# Patient Record
Sex: Female | Born: 1965
Health system: Southern US, Community
[De-identification: ages and names within clinical notes are randomized; demographics above are authoritative.]

## PROBLEM LIST (undated history)

## (undated) DIAGNOSIS — D259 Leiomyoma of uterus, unspecified: Secondary | ICD-10-CM

## (undated) DIAGNOSIS — N840 Polyp of corpus uteri: Secondary | ICD-10-CM

## (undated) DIAGNOSIS — N926 Irregular menstruation, unspecified: Secondary | ICD-10-CM

## (undated) DIAGNOSIS — E039 Hypothyroidism, unspecified: Secondary | ICD-10-CM

## (undated) HISTORY — DX: Hypothyroidism, unspecified: E03.9

## (undated) HISTORY — PX: TUBAL LIGATION: SHX77

## (undated) HISTORY — PX: TONSILLECTOMY: SUR1361

---

## 2002-07-31 HISTORY — PX: TUBAL LIGATION: SHX77

## 2015-08-12 MED FILL — TRI-LO-MARZIA TABLET: 0.18/0.215/ | 28 days supply | Qty: 28 | Fill #0

## 2015-09-09 MED FILL — TRI-LO-MARZIA TABLET: 0.18/0.215/ | 84 days supply | Qty: 84 | Fill #1

## 2015-09-15 MED FILL — LEVOTHYROXINE 112 MCG TAB: 112 | 84 days supply | Qty: 72 | Fill #0

## 2015-11-02 DIAGNOSIS — J029 Acute pharyngitis, unspecified: Secondary | ICD-10-CM | POA: Diagnosis not present

## 2015-11-26 DIAGNOSIS — H52223 Regular astigmatism, bilateral: Secondary | ICD-10-CM | POA: Diagnosis not present

## 2015-11-26 DIAGNOSIS — H524 Presbyopia: Secondary | ICD-10-CM | POA: Diagnosis not present

## 2015-12-02 MED FILL — TRI-LO-MARZIA TABLET: 0.18/0.215/ | 84 days supply | Qty: 84 | Fill #2

## 2016-02-29 MED FILL — TRI-LO-MARZIA TABLET: 0.18/0.215/ | 28 days supply | Qty: 28 | Fill #0

## 2016-06-15 ENCOUNTER — Ambulatory Visit: Payer: Self-pay | Admitting: Women's Health

## 2016-06-19 ENCOUNTER — Telehealth: Payer: Self-pay | Admitting: Emergency Medicine

## 2016-06-19 NOTE — Telephone Encounter (Signed)
appt made

## 2016-06-19 NOTE — Telephone Encounter (Signed)
Pt is wanting to know if you would be willing to see her as a new patient? You see her husband already. Please advise thanks.

## 2016-06-19 NOTE — Telephone Encounter (Signed)
yes

## 2016-06-27 ENCOUNTER — Encounter: Payer: Self-pay | Admitting: Women's Health

## 2016-06-27 ENCOUNTER — Ambulatory Visit (INDEPENDENT_AMBULATORY_CARE_PROVIDER_SITE_OTHER): Payer: 59 | Admitting: Women's Health

## 2016-06-27 ENCOUNTER — Other Ambulatory Visit: Payer: Self-pay | Admitting: Women's Health

## 2016-06-27 VITALS — BP 120/82 | Ht 67.0 in | Wt 179.0 lb

## 2016-06-27 DIAGNOSIS — Z01419 Encounter for gynecological examination (general) (routine) without abnormal findings: Secondary | ICD-10-CM

## 2016-06-27 DIAGNOSIS — Z13220 Encounter for screening for lipoid disorders: Secondary | ICD-10-CM

## 2016-06-27 DIAGNOSIS — N920 Excessive and frequent menstruation with regular cycle: Secondary | ICD-10-CM

## 2016-06-27 DIAGNOSIS — E038 Other specified hypothyroidism: Secondary | ICD-10-CM

## 2016-06-27 DIAGNOSIS — Z1231 Encounter for screening mammogram for malignant neoplasm of breast: Secondary | ICD-10-CM

## 2016-06-27 LAB — CBC WITH DIFFERENTIAL/PLATELET
BASOS PCT: 0 %
Basophils Absolute: 0 cells/uL (ref 0–200)
EOS ABS: 162 {cells}/uL (ref 15–500)
Eosinophils Relative: 2 %
HEMATOCRIT: 41.5 % (ref 35.0–45.0)
Hemoglobin: 13.7 g/dL (ref 11.7–15.5)
Lymphocytes Relative: 23 %
Lymphs Abs: 1863 cells/uL (ref 850–3900)
MCH: 30.3 pg (ref 27.0–33.0)
MCHC: 33 g/dL (ref 32.0–36.0)
MCV: 91.8 fL (ref 80.0–100.0)
MONO ABS: 405 {cells}/uL (ref 200–950)
MPV: 9.5 fL (ref 7.5–12.5)
Monocytes Relative: 5 %
NEUTROS ABS: 5670 {cells}/uL (ref 1500–7800)
Neutrophils Relative %: 70 %
PLATELETS: 303 10*3/uL (ref 140–400)
RBC: 4.52 MIL/uL (ref 3.80–5.10)
RDW: 13.8 % (ref 11.0–15.0)
WBC: 8.1 10*3/uL (ref 3.8–10.8)

## 2016-06-27 LAB — HM PAP SMEAR

## 2016-06-27 LAB — T3 UPTAKE: T3 Uptake: 28 % (ref 22–35)

## 2016-06-27 LAB — TSH: TSH: 3.01 m[IU]/L

## 2016-06-27 LAB — T4: T4 TOTAL: 7.6 ug/dL (ref 4.5–12.0)

## 2016-06-27 MED ORDER — LEVOTHYROXINE SODIUM 112 MCG PO TABS: 112.0000 ug | ORAL_TABLET | Freq: Every day | ORAL | 4 refills | Status: DC

## 2016-06-27 MED ORDER — NORETHIN ACE-ETH ESTRAD-FE 1-20 MG-MCG PO TABS: 1.0000 | ORAL_TABLET | Freq: Every day | ORAL | 4 refills | Status: DC

## 2016-06-27 MED FILL — NORETHIN-ESTRAD-FERR 1-0.02: 1-20 | 84 days supply | Qty: 84 | Fill #0

## 2016-06-27 NOTE — Addendum Note (Signed)
Addended by: Burnett Kanaris on: 06/27/2016 12:28 PM   Modules accepted: Orders

## 2016-06-27 NOTE — Patient Instructions (Addendum)
[facility] center  [phone]  [facility] [address] Health Maintenance, Female Introduction Adopting a healthy lifestyle and getting preventive care can go a long way to promote health and wellness. Talk with your health care provider about what schedule of regular examinations is right for you. This is a good chance for you to check in with your provider about disease prevention and staying healthy. In between checkups, there are plenty of things you can do on your own. Experts have done a lot of research about which lifestyle changes and preventive measures are most likely to keep you healthy. Ask your health care provider for more information. Weight and diet Eat a healthy diet  Be sure to include plenty of vegetables, fruits, low-fat dairy products, and lean protein.  Do not eat a lot of foods high in solid fats, added sugars, or salt.  Get regular exercise. This is one of the most important things you can do for your health.  Most adults should exercise for at least 150 minutes each week. The exercise should increase your heart rate and make you sweat (moderate-intensity exercise).  Most adults should also do strengthening exercises at least twice a week. This is in addition to the moderate-intensity exercise. Maintain a healthy weight  Body mass index (BMI) is a measurement that can be used to identify possible weight problems. It estimates body fat based on height and weight. Your health care provider can help determine your BMI and help you achieve or maintain a healthy weight.  For females 49 years of age and older:  A BMI below 18.5 is considered underweight.  A BMI of 18.5 to 24.9 is normal.  A BMI of 25 to 29.9 is considered overweight.  A BMI of 30 and above is considered obese. Watch levels of cholesterol and blood lipids  You should start having your blood tested for lipids and cholesterol at 48 years of age, then have this test every 5 years.  You may need to  have your cholesterol levels checked more often if:  Your lipid or cholesterol levels are high.  You are older than 48 years of age.  You are at high risk for heart disease. Cancer screening Lung Cancer  Lung cancer screening is recommended for adults 38-26 years old who are at high risk for lung cancer because of a history of smoking.  A yearly low-dose CT scan of the lungs is recommended for people who:  Currently smoke.  Have quit within the past 15 years.  Have at least a 30-pack-year history of smoking. A pack year is smoking an average of one pack of cigarettes a day for 1 year.  Yearly screening should continue until it has been 15 years since you quit.  Yearly screening should stop if you develop a health problem that would prevent you from having lung cancer treatment. Breast Cancer  Practice breast self-awareness. This means understanding how your breasts normally appear and feel.  It also means doing regular breast self-exams. Let your health care provider know about any changes, no matter how small.  If you are in your 20s or 30s, you should have a clinical breast exam (CBE) by a health care provider every 1-3 years as part of a regular health exam.  If you are 75 or older, have a CBE every year. Also consider having a breast X-ray (mammogram) every year.  If you have a family history of breast cancer, talk to your health care provider about genetic  screening.  If you are at high risk for breast cancer, talk to your health care provider about having an MRI and a mammogram every year.  Breast cancer gene (BRCA) assessment is recommended for women who have family members with BRCA-related cancers. BRCA-related cancers include:  Breast.  Ovarian.  Tubal.  Peritoneal cancers.  Results of the assessment will determine the need for genetic counseling and BRCA1 and BRCA2 testing. Cervical Cancer  Your health care provider may recommend that you be screened  regularly for cancer of the pelvic organs (ovaries, uterus, and vagina). This screening involves a pelvic examination, including checking for microscopic changes to the surface of your cervix (Pap test). You may be encouraged to have this screening done every 3 years, beginning at age 69.  For women ages 20-65, health care providers may recommend pelvic exams and Pap testing every 3 years, or they may recommend the Pap and pelvic exam, combined with testing for human papilloma virus (HPV), every 5 years. Some types of HPV increase your risk of cervical cancer. Testing for HPV may also be done on women of any age with unclear Pap test results.  Other health care providers may not recommend any screening for nonpregnant women who are considered low risk for pelvic cancer and who do not have symptoms. Ask your health care provider if a screening pelvic exam is right for you.  If you have had past treatment for cervical cancer or a condition that could lead to cancer, you need Pap tests and screening for cancer for at least 20 years after your treatment. If Pap tests have been discontinued, your risk factors (such as having a new sexual partner) need to be reassessed to determine if screening should resume. Some women have medical problems that increase the chance of getting cervical cancer. In these cases, your health care provider may recommend more frequent screening and Pap tests. Colorectal Cancer  This type of cancer can be detected and often prevented.  Routine colorectal cancer screening usually begins at 48 years of age and continues through 48 years of age.  Your health care provider may recommend screening at an earlier age if you have risk factors for colon cancer.  Your health care provider may also recommend using home test kits to check for hidden blood in the stool.  A small camera at the end of a tube can be used to examine your colon directly (sigmoidoscopy or colonoscopy). This is  done to check for the earliest forms of colorectal cancer.  Routine screening usually begins at age 40.  Direct examination of the colon should be repeated every 5-10 years through 48 years of age. However, you may need to be screened more often if early forms of precancerous polyps or small growths are found. Skin Cancer  Check your skin from head to toe regularly.  Tell your health care provider about any new moles or changes in moles, especially if there is a change in a mole's shape or color.  Also tell your health care provider if you have a mole that is larger than the size of a pencil eraser.  Always use sunscreen. Apply sunscreen liberally and repeatedly throughout the day.  Protect yourself by wearing long sleeves, pants, a wide-brimmed hat, and sunglasses whenever you are outside. Heart disease, diabetes, and high blood pressure  High blood pressure causes heart disease and increases the risk of stroke. High blood pressure is more likely to develop in:  People who have blood pressure  in the high end of the normal range (130-139/85-89 mm Hg).  People who are overweight or obese.  People who are African American.  If you are 45-64 years of age, have your blood pressure checked every 3-5 years. If you are 63 years of age or older, have your blood pressure checked every year. You should have your blood pressure measured twice-once when you are at a hospital or clinic, and once when you are not at a hospital or clinic. Record the average of the two measurements. To check your blood pressure when you are not at a hospital or clinic, you can use:  An automated blood pressure machine at a pharmacy.  A home blood pressure monitor.  If you are between 44 years and 36 years old, ask your health care provider if you should take aspirin to prevent strokes.  Have regular diabetes screenings. This involves taking a blood sample to check your fasting blood sugar level.  If you are at a  normal weight and have a low risk for diabetes, have this test once every three years after 48 years of age.  If you are overweight and have a high risk for diabetes, consider being tested at a younger age or more often. Preventing infection Hepatitis B  If you have a higher risk for hepatitis B, you should be screened for this virus. You are considered at high risk for hepatitis B if:  You were born in a country where hepatitis B is common. Ask your health care provider which countries are considered high risk.  Your parents were born in a high-risk country, and you have not been immunized against hepatitis B (hepatitis B vaccine).  You have HIV or AIDS.  You use needles to inject street drugs.  You live with someone who has hepatitis B.  You have had sex with someone who has hepatitis B.  You get hemodialysis treatment.  You take certain medicines for conditions, including cancer, organ transplantation, and autoimmune conditions. Hepatitis C  Blood testing is recommended for:  Everyone born from [birth year] through 1963.  Anyone with known risk factors for hepatitis C. Sexually transmitted infections (STIs)  You should be screened for sexually transmitted infections (STIs) including gonorrhea and chlamydia if:  You are sexually active and are younger than 48 years of age.  You are older than 48 years of age and your health care provider tells you that you are at risk for this type of infection.  Your sexual activity has changed since you were last screened and you are at an increased risk for chlamydia or gonorrhea. Ask your health care provider if you are at risk.  If you do not have HIV, but are at risk, it may be recommended that you take a prescription medicine daily to prevent HIV infection. This is called pre-exposure prophylaxis (PrEP). You are considered at risk if:  You are sexually active and do not regularly use condoms or know the HIV status of your partner(s).  You  take drugs by injection.  You are sexually active with a partner who has HIV. Talk with your health care provider about whether you are at high risk of being infected with HIV. If you choose to begin PrEP, you should first be tested for HIV. You should then be tested every 3 months for as long as you are taking PrEP. Pregnancy  If you are premenopausal and you may become pregnant, ask your health care provider about preconception counseling.  If you  may become pregnant, take 400 to 800 micrograms (mcg) of folic acid every day.  If you want to prevent pregnancy, talk to your health care provider about birth control (contraception). Osteoporosis and menopause  Osteoporosis is a disease in which the bones lose minerals and strength with aging. This can result in serious bone fractures. Your risk for osteoporosis can be identified using a bone density scan.  If you are 74 years of age or older, or if you are at risk for osteoporosis and fractures, ask your health care provider if you should be screened.  Ask your health care provider whether you should take a calcium or vitamin D supplement to lower your risk for osteoporosis.  Menopause may have certain physical symptoms and risks.  Hormone replacement therapy may reduce some of these symptoms and risks. Talk to your health care provider about whether hormone replacement therapy is right for you. Follow these instructions at home:  Schedule regular health, dental, and eye exams.  Stay current with your immunizations.  Do not use any tobacco products including cigarettes, chewing tobacco, or electronic cigarettes.  If you are pregnant, do not drink alcohol.  If you are breastfeeding, limit how much and how often you drink alcohol.  Limit alcohol intake to no more than 1 drink per day for nonpregnant women. One drink equals 12 ounces of beer, 5 ounces of wine, or 1 ounces of hard liquor.  Do not use street drugs.  Do not share  needles.  Ask your health care provider for help if you need support or information about quitting drugs.  Tell your health care provider if you often feel depressed.  Tell your health care provider if you have ever been abused or do not feel safe at home. This information is not intended to replace advice given to you by your health care provider. Make sure you discuss any questions you have with your health care provider. Document Released: 01/30/2011 Document Revised: 12/23/2015 Document Reviewed: 04/20/2015  2017 Elsevier  Endometrial Ablation Endometrial ablation removes the lining of the uterus (endometrium). It is usually a same-day, outpatient treatment. Ablation helps avoid major surgery, such as surgery to remove the cervix and uterus (hysterectomy). After endometrial ablation, you will have little or no menstrual bleeding and may not be able to have children. However, if you are premenopausal, you will need to use a reliable method of birth control following the procedure because of the small chance that pregnancy can occur. There are different reasons to have this procedure. These reasons include:  Heavy periods.  Bleeding that is causing anemia.  Irregular bleeding.  Bleeding fibroids on the lining inside the uterus if they are smaller than 3 centimeters. This procedure may not be possible for you if:   You want to have children in the future.   You have severe cramps with your menstrual period.   You have precancerous or cancerous cells in your uterus.   You were recently pregnant.   You have gone through menopause.   You have had major surgery on your uterus, resulting in thinning of the uterine wall. Surgeries may include:  The removal of one or more uterine fibroids (myomectomy).  A cesarean section with a classic (vertical) incision on your uterus. Ask your health care provider what type of cesarean you had. Sometimes the scar on your skin is different  than the scar on your uterus. Even if you have had surgery on your uterus, certain types of ablation may still  be safe for you. Talk with your health care provider. LET [facility] CARE PROVIDER KNOW ABOUT:  Any allergies you have.  All medicines you are taking, including vitamins, herbs, eye drops, creams, and over-the-counter medicines.  Previous problems you or members of your family have had with the use of anesthetics.  Any blood disorders you have.  Previous surgeries you have had.  Medical conditions you have. RISKS AND COMPLICATIONS  Generally, this is a safe procedure. However, as with any procedure, complications can occur. Possible complications include:  Perforation of the uterus.  Bleeding.  Infection of the uterus, bladder, or vagina.  Injury to surrounding organs.  An air bubble to the lung (air embolus).  Pregnancy following the procedure.  Failure of the procedure to help the problem, requiring hysterectomy.  Decreased ability to diagnose cancer in the lining of the uterus. BEFORE THE PROCEDURE  The lining of the uterus must be tested to make sure there is no pre-cancerous or cancer cells present.  An ultrasound may be performed to look at the size of the uterus and to check for abnormalities.  Medicines may be given to thin the lining of the uterus. PROCEDURE  During the procedure, your health care provider will use a tool called a resectoscope to help see inside your uterus. There are different ways to remove the lining of your uterus.   Radiofrequency - This method uses a radiofrequency-alternating electric current to remove the lining of the uterus.  Cryotherapy - This method uses extreme cold to freeze the lining of the uterus.  Heated-Free Liquid - This method uses heated salt (saline) solution to remove the lining of the uterus.  Microwave - This method uses high-energy microwaves to heat up the lining of the uterus to remove it.  Thermal  balloon - This method involves inserting a catheter with a balloon tip into the uterus. The balloon tip is filled with heated fluid to remove the lining of the uterus. AFTER THE PROCEDURE  After your procedure, do not have sexual intercourse or insert anything into your vagina until permitted by your health care provider. After the procedure, you may experience:  Cramps.  Vaginal discharge.  Frequent urination. This information is not intended to replace advice given to you by your health care provider. Make sure you discuss any questions you have with your health care provider. Document Released: 05/26/2004 Document Revised: 04/07/2015 Document Reviewed: 12/18/2012 Elsevier Interactive Patient Education  2017 [employer].  Hypothyroidism Hypothyroidism is a disorder of the thyroid. The thyroid is a large gland that is located in the lower front of the neck. The thyroid releases hormones that control how the body works. With hypothyroidism, the thyroid does not make enough of these hormones. What are the causes? Causes of hypothyroidism may include:  Viral infections.  Pregnancy.  Your own defense system (immune system) attacking your thyroid.  Certain medicines.  Birth defects.  Past radiation treatments to your head or neck.  Past treatment with radioactive iodine.  Past surgical removal of part or all of your thyroid.  Problems with the gland that is located in the center of your brain (pituitary). What are the signs or symptoms? Signs and symptoms of hypothyroidism may include:  Feeling as though you have no energy (lethargy).  Inability to tolerate cold.  Weight gain that is not explained by a change in diet or exercise habits.  Dry skin.  Coarse hair.  Menstrual irregularity.  Slowing of thought processes.  Constipation.  Sadness  or depression. How is this diagnosed? Your health care provider may diagnose hypothyroidism with blood tests and ultrasound  tests. How is this treated? Hypothyroidism is treated with medicine that replaces the hormones that your body does not make. After you begin treatment, it may take several weeks for symptoms to go away. Follow these instructions at home:  Take medicines only as directed by your health care provider.  If you start taking any new medicines, tell your health care provider.  Keep all follow-up visits as directed by your health care provider. This is important. As your condition improves, your dosage needs may change. You will need to have blood tests regularly so that your health care provider can watch your condition. Contact a health care provider if:  Your symptoms do not get better with treatment.  You are taking thyroid replacement medicine and:  You sweat excessively.  You have tremors.  You feel anxious.  You lose weight rapidly.  You cannot tolerate heat.  You have emotional swings.  You have diarrhea.  You feel weak. Get help right away if:  You develop chest pain.  You develop an irregular heartbeat.  You develop a rapid heartbeat. This information is not intended to replace advice given to you by your health care provider. Make sure you discuss any questions you have with your health care provider. Document Released: 07/17/2005 Document Revised: 12/23/2015 Document Reviewed: 12/02/2013 Elsevier Interactive Patient Education  2017 [employer].

## 2016-06-27 NOTE — Progress Notes (Signed)
Kristen Mccarty [birth year] 50 [id number]    History:    Presents for new patient annual exam.  Monthly cycle/BTL. Recently moved here from [place], had been on OCs  with good relief of menorrhagia.  Reports normal Pap and mammogram history.  Past medical history, past surgical history, family history and social history were all reviewed and documented in the EPIC chart. 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 Oldest son at [facility], 48 year old daughter at [facility], 24 year old son at home all have had gardasil. Originally from [place].  ROS:  A ROS was performed and pertinent positives and negatives are included.  Exam:  Vitals:   06/27/16 1106  BP: 120/82  Weight: 179 lb (81.2 kg)  Height: 5\' 7"  (1.702 m)   Body mass index is 28.04 kg/m.   General appearance:  Normal Thyroid:  Symmetrical, normal in size, without palpable masses or nodularity. Respiratory  Auscultation:  Clear without wheezing or rhonchi Cardiovascular  Auscultation:  Regular rate, without rubs, murmurs or gallops  Edema/varicosities:  Not grossly evident Abdominal  Soft,nontender, without masses, guarding or rebound.  Liver/spleen:  No organomegaly noted  Hernia:  None appreciated  Skin  Inspection:  Grossly normal   Breasts: Examined lying and sitting.     Right: Without masses, retractions, discharge or axillary adenopathy.     Left: Without masses, retractions, discharge or axillary adenopathy. Gentitourinary   Inguinal/mons:  Normal without inguinal adenopathy  External genitalia:  Normal  BUS/Urethra/Skene's glands:  Normal  Vagina:  Normal  Cervix:  Normal  Uterus:   normal in size, shape and contour.  Midline and mobile  Adnexa/parametria:     Rt: Without masses or tenderness.   Lt: Without masses or tenderness.  Anus and perineum: Normal  Digital rectal exam: Normal sphincter tone without palpated masses or tenderness  Assessment/Plan:  50 y.o. MWF G3 P3  for annual exam  requesting to get back on OCs.  Monthly cycle/BTL with menorrhagia Hypothyroid-SYNTHROID 112 since August or September/ ran out    of Rx.  Plan: Options reviewed, Loestrin 1/20 prescription, proper use, slight risk for blood clots and strokes reviewed. Start first day of next cycle. Take daily. SBE's, continue annual screening mammogram, breast center information given instructed to call and schedule. Reviewed importance of regular exercise, calcium rich diet, vitamin D 1000 daily encouraged. Synthroid 112 prescription, proper use given and reviewed, recheck TSH in 3 months, CBC, CMP, T3, T4, TSH all of vitamin D, lipid panel, UA, Pap with HR HPV typing, new screening guidelines reviewed. Endometrial ablation reviewed. Instructed to recheck blood pressure in 3 months to make sure no elevations.    Huel Cote [facility], 12:04 PM 06/27/2016

## 2016-06-28 LAB — LIPID PANEL
CHOL/HDL RATIO: 2.8 ratio (ref <5.0)
CHOLESTEROL: 173 mg/dL (ref <200)
HDL: 62 mg/dL (ref >50)
LDL Cholesterol: 89 mg/dL (ref <100)
Triglycerides: 108 mg/dL (ref <150)
VLDL: 22 mg/dL (ref <30)

## 2016-06-28 LAB — URINALYSIS W MICROSCOPIC + REFLEX CULTURE
Bacteria, UA: NONE SEEN [HPF]
Bilirubin Urine: NEGATIVE
CASTS: NONE SEEN [LPF]
Crystals: NONE SEEN [HPF]
Glucose, UA: NEGATIVE
Hgb urine dipstick: NEGATIVE
Ketones, ur: NEGATIVE
Leukocytes, UA: NEGATIVE
NITRITE: NEGATIVE
PH: 5.5 (ref 5.0–8.0)
Protein, ur: NEGATIVE
RBC / HPF: NONE SEEN RBC/HPF (ref <=2)
SPECIFIC GRAVITY, URINE: 1.023 (ref 1.001–1.035)
Squamous Epithelial / LPF: NONE SEEN [HPF] (ref <=5)
WBC, UA: NONE SEEN WBC/HPF (ref <=5)
YEAST: NONE SEEN [HPF]

## 2016-06-28 LAB — COMPREHENSIVE METABOLIC PANEL
ALK PHOS: 76 U/L (ref 33–115)
ALT: 23 U/L (ref 6–29)
AST: 21 U/L (ref 10–35)
Albumin: 4.5 g/dL (ref 3.6–5.1)
BILIRUBIN TOTAL: 1 mg/dL (ref 0.2–1.2)
BUN: 19 mg/dL (ref 7–25)
CALCIUM: 9.1 mg/dL (ref 8.6–10.2)
CO2: 24 mmol/L (ref 20–31)
Chloride: 105 mmol/L (ref 98–110)
Creat: 0.76 mg/dL (ref 0.50–1.10)
Glucose, Bld: 78 mg/dL (ref 65–99)
POTASSIUM: 4 mmol/L (ref 3.5–5.3)
Sodium: 140 mmol/L (ref 135–146)
TOTAL PROTEIN: 6.6 g/dL (ref 6.1–8.1)

## 2016-06-28 LAB — VITAMIN D 25 HYDROXY (VIT D DEFICIENCY, FRACTURES): Vit D, 25-Hydroxy: 31 ng/mL (ref 30–100)

## 2016-06-29 LAB — PAP, TP IMAGING W/ HPV RNA, RFLX HPV TYPE 16,18/45: HPV MRNA, HIGH RISK: NOT DETECTED

## 2016-06-30 ENCOUNTER — Other Ambulatory Visit: Payer: Self-pay | Admitting: Women's Health

## 2016-06-30 DIAGNOSIS — R7989 Other specified abnormal findings of blood chemistry: Secondary | ICD-10-CM

## 2016-06-30 MED ORDER — LEVOTHYROXINE SODIUM 75 MCG PO TABS: 75.0000 ug | ORAL_TABLET | Freq: Every day | ORAL | 1 refills | Status: DC

## 2016-06-30 MED FILL — LEVOTHYROXINE 75 MCG TABLET: 75 | 30 days supply | Qty: 30 | Fill #0

## 2016-07-11 ENCOUNTER — Ambulatory Visit (INDEPENDENT_AMBULATORY_CARE_PROVIDER_SITE_OTHER): Payer: 59 | Admitting: Internal Medicine

## 2016-07-11 ENCOUNTER — Encounter: Payer: Self-pay | Admitting: Internal Medicine

## 2016-07-11 VITALS — BP 120/78 | HR 79 | Temp 98.2°F | Ht 67.0 in | Wt 179.4 lb

## 2016-07-11 DIAGNOSIS — Z0000 Encounter for general adult medical examination without abnormal findings: Secondary | ICD-10-CM | POA: Diagnosis not present

## 2016-07-11 DIAGNOSIS — Z23 Encounter for immunization: Secondary | ICD-10-CM | POA: Diagnosis not present

## 2016-07-11 DIAGNOSIS — G4719 Other hypersomnia: Secondary | ICD-10-CM | POA: Diagnosis not present

## 2016-07-11 NOTE — Patient Instructions (Signed)
Hypothyroidism Hypothyroidism is a disorder of the thyroid. The thyroid is a large gland that is located in the lower front of the neck. The thyroid releases hormones that control how the body works. With hypothyroidism, the thyroid does not make enough of these hormones. What are the causes? Causes of hypothyroidism may include:  Viral infections.  Pregnancy.  Your own defense system (immune system) attacking your thyroid.  Certain medicines.  Birth defects.  Past radiation treatments to your head or neck.  Past treatment with radioactive iodine.  Past surgical removal of part or all of your thyroid.  Problems with the gland that is located in the center of your brain (pituitary).  What are the signs or symptoms? Signs and symptoms of hypothyroidism may include:  Feeling as though you have no energy (lethargy).  Inability to tolerate cold.  Weight gain that is not explained by a change in diet or exercise habits.  Dry skin.  Coarse hair.  Menstrual irregularity.  Slowing of thought processes.  Constipation.  Sadness or depression.  How is this diagnosed? Your health care provider may diagnose hypothyroidism with blood tests and ultrasound tests. How is this treated? Hypothyroidism is treated with medicine that replaces the hormones that your body does not make. After you begin treatment, it may take several weeks for symptoms to go away. Follow these instructions at home:  Take medicines only as directed by your health care provider.  If you start taking any new medicines, tell your health care provider.  Keep all follow-up visits as directed by your health care provider. This is important. As your condition improves, your dosage needs may change. You will need to have blood tests regularly so that your health care provider can watch your condition. Contact a health care provider if:  Your symptoms do not get better with treatment.  You are taking thyroid  replacement medicine and: ? You sweat excessively. ? You have tremors. ? You feel anxious. ? You lose weight rapidly. ? You cannot tolerate heat. ? You have emotional swings. ? You have diarrhea. ? You feel weak. Get help right away if:  You develop chest pain.  You develop an irregular heartbeat.  You develop a rapid heartbeat. This information is not intended to replace advice given to you by your health care provider. Make sure you discuss any questions you have with your health care provider. Document Released: 07/17/2005 Document Revised: 12/23/2015 Document Reviewed: 12/02/2013 Elsevier Interactive Patient Education  2017 [employer]  

## 2016-07-11 NOTE — Progress Notes (Signed)
Pre visit review using our clinic review tool, if applicable. No additional management support is needed unless otherwise documented below in the visit note. 

## 2016-07-11 NOTE — Progress Notes (Signed)
Subjective:  Patient ID: Kristen Mccarty, female    DOB: 29-Mar-1966  Age: 50 y.o. MRN: [id number]  CC: Annual Exam   HPI Kristen Mccarty presents for a CPX.  She has a history of hypothyroidism and a recent TSH was 3.01. She complains for several years that she has had daytime sleepiness and fatigue. Her husband has a severe snoring problem. She doesn't admit to having a problem with snoring or sleep apnea. She states that the recent reinstitution of her thyroid supplement has not made her feel better. She takes birth control pills to regulate her menstrual cycles.  History Kristen Mccarty has a past medical history of Hypothyroidism.   She has a past surgical history that includes Tubal ligation and Tonsillectomy.   Her family history includes Cancer (age of onset: 31) in her father; Diabetes in her father; Heart attack in her maternal grandmother; Heart disease in her father; Leukemia in her paternal grandmother; Osteoporosis in her mother.She reports that she has never smoked. She has never used smokeless tobacco. She reports that she does not drink alcohol or use drugs.  Outpatient Medications Prior to Visit  Medication Sig Dispense Refill  . levothyroxine (SYNTHROID) 75 MCG tablet Take 1 tablet (75 mcg total) by mouth daily before breakfast. 30 tablet 1  . norethindrone-ethinyl estradiol (JUNEL FE,GILDESS FE,LOESTRIN FE) 1-20 MG-MCG tablet Take 1 tablet by mouth daily. 3 Package 4   No facility-administered medications prior to visit.     ROS Review of Systems  Constitutional: Positive for fatigue. Negative for activity change, appetite change, diaphoresis and unexpected weight change.  HENT: Negative.   Eyes: Negative.   Respiratory: Negative for apnea, cough, chest tightness, shortness of breath, wheezing and stridor.   Cardiovascular: Negative.  Negative for chest pain, palpitations and leg swelling.  Gastrointestinal: Negative for abdominal pain, blood in stool, constipation, diarrhea,  nausea and vomiting.  Endocrine: Negative.   Genitourinary: Negative.   Musculoskeletal: Negative.  Negative for arthralgias, back pain, myalgias and neck pain.  Skin: Negative.  Negative for color change and rash.  Allergic/Immunologic: Negative.   Neurological: Negative.  Negative for dizziness, weakness, light-headedness and headaches.  Hematological: Negative.  Negative for adenopathy. Does not bruise/bleed easily.  Psychiatric/Behavioral: Negative.  Negative for behavioral problems, decreased concentration, dysphoric mood, self-injury and suicidal ideas. The patient is not nervous/anxious.     Objective:  BP 120/78 (BP Location: Left Arm, Patient Position: Sitting, Cuff Size: Large)   Pulse 79   Temp 98.2 F (36.8 C) (Oral)   Ht 5\' 7"  (1.702 m)   Wt 179 lb 6 oz (81.4 kg)   LMP 07/10/2016 (Within Days)   SpO2 98%   BMI 28.09 kg/m   Physical Exam  Constitutional: She is oriented to person, place, and time. She appears well-developed and well-nourished. No distress.  HENT:  Head: Normocephalic and atraumatic.  Mouth/Throat: Oropharynx is clear and moist. No oropharyngeal exudate.  Eyes: Conjunctivae are normal. Right eye exhibits no discharge. Left eye exhibits no discharge. No scleral icterus.  Neck: Normal range of motion. Neck supple. No JVD present. No tracheal deviation present. No thyromegaly present.  Cardiovascular: Normal rate, regular rhythm, normal heart sounds and intact distal pulses.  Exam reveals no gallop and no friction rub.   No murmur heard. Pulmonary/Chest: Effort normal and breath sounds normal. No stridor. No respiratory distress. She has no wheezes. She has no rales. She exhibits no tenderness.  Abdominal: Soft. Bowel sounds are normal. She exhibits no distension and no mass.  There is no tenderness. There is no rebound and no guarding.  Musculoskeletal: Normal range of motion. She exhibits no edema or deformity.  Lymphadenopathy:    She has no cervical  adenopathy.  Neurological: She is oriented to person, place, and time.  Skin: Skin is warm and dry. No rash noted. She is not diaphoretic. No erythema. No pallor.  Psychiatric: She has a normal mood and affect. Her behavior is normal. Judgment and thought content normal.  Vitals reviewed.   Lab Results  Component Value Date   WBC 8.1 06/27/2016   HGB 13.7 06/27/2016   HCT 41.5 06/27/2016   PLT 303 06/27/2016   GLUCOSE 78 06/27/2016   CHOL 173 06/27/2016   TRIG 108 06/27/2016   HDL 62 06/27/2016   LDLCALC 89 06/27/2016   ALT 23 06/27/2016   AST 21 06/27/2016   NA 140 06/27/2016   K 4.0 06/27/2016   CL 105 06/27/2016   CREATININE 0.76 06/27/2016   BUN 19 06/27/2016   CO2 24 06/27/2016   TSH 3.01 06/27/2016    Assessment & Plan:   Kristen Mccarty was seen today for annual exam.  Diagnoses and all orders for this visit:  Excessive daytime sleepiness- her review of systems is otherwise negative, recent labs were normal, I don't think this is related to hypothyroidism. I am concerned about a sleep disorder and have asked her to be evaluated in the sleep clinic. -     Ambulatory referral to Pulmonology  Need for prophylactic vaccination with combined diphtheria-tetanus-pertussis (DTP) vaccine -     Tdap vaccine greater than or equal to 7yo IM  Routine general medical examination at a health care facility- she saw her gynecologist about a month ago so labs, Pap smear, and mammogram of all been addressed. Today her vaccines were reviewed and updated. Patient education material was given.   I am having Kristen Mccarty maintain her norethindrone-ethinyl estradiol and levothyroxine.  No orders of the defined types were placed in this encounter.  Lab Results  Component Value Date   WBC 8.1 06/27/2016   HGB 13.7 06/27/2016   HCT 41.5 06/27/2016   PLT 303 06/27/2016   GLUCOSE 78 06/27/2016   CHOL 173 06/27/2016   TRIG 108 06/27/2016   HDL 62 06/27/2016   LDLCALC 89 06/27/2016   ALT 23  06/27/2016   AST 21 06/27/2016   NA 140 06/27/2016   K 4.0 06/27/2016   CL 105 06/27/2016   CREATININE 0.76 06/27/2016   BUN 19 06/27/2016   CO2 24 06/27/2016   TSH 3.01 06/27/2016    Follow-up: Return in about 3 months (around 10/09/2016).  Scarlette Calico, MD

## 2016-07-12 DIAGNOSIS — Z0000 Encounter for general adult medical examination without abnormal findings: Secondary | ICD-10-CM | POA: Insufficient documentation

## 2016-07-18 ENCOUNTER — Ambulatory Visit
Admission: RE | Admit: 2016-07-18 | Discharge: 2016-07-18 | Disposition: A | Discharge status: Home / Self Care | Payer: 59 | Source: Ambulatory Visit | Attending: Women's Health | Admitting: Women's Health

## 2016-07-18 DIAGNOSIS — Z1231 Encounter for screening mammogram for malignant neoplasm of breast: Secondary | ICD-10-CM | POA: Diagnosis not present

## 2016-07-27 ENCOUNTER — Encounter: Payer: Self-pay | Admitting: Women's Health

## 2016-08-11 MED FILL — LEVOTHYROXINE 75 MCG TABLET: 75 | 30 days supply | Qty: 30 | Fill #1

## 2016-08-28 ENCOUNTER — Other Ambulatory Visit: Payer: Self-pay

## 2016-08-31 ENCOUNTER — Other Ambulatory Visit: Payer: 59

## 2016-08-31 DIAGNOSIS — R946 Abnormal results of thyroid function studies: Secondary | ICD-10-CM | POA: Diagnosis not present

## 2016-08-31 DIAGNOSIS — R7989 Other specified abnormal findings of blood chemistry: Secondary | ICD-10-CM

## 2016-08-31 LAB — TSH: TSH: 1.36 mIU/L

## 2016-09-01 ENCOUNTER — Encounter: Payer: Self-pay | Admitting: Women's Health

## 2016-09-04 ENCOUNTER — Other Ambulatory Visit: Payer: Self-pay | Admitting: Women's Health

## 2016-09-04 DIAGNOSIS — E038 Other specified hypothyroidism: Secondary | ICD-10-CM

## 2016-09-04 MED ORDER — LEVOTHYROXINE SODIUM 75 MCG PO TABS: 75.0000 ug | ORAL_TABLET | Freq: Every day | ORAL | 4 refills | Status: DC

## 2016-09-05 MED FILL — LEVOTHYROXINE 75 MCG TABLET: 75 | 90 days supply | Qty: 90 | Fill #0

## 2016-09-27 ENCOUNTER — Institutional Professional Consult (permissible substitution): Payer: 59 | Admitting: Pulmonary Disease

## 2016-10-11 ENCOUNTER — Ambulatory Visit: Payer: 59 | Admitting: Internal Medicine

## 2016-12-11 MED FILL — LEVOTHYROXINE 75 MCG TABLET: 75 | 90 days supply | Qty: 90 | Fill #1

## 2016-12-19 ENCOUNTER — Encounter: Payer: Self-pay | Admitting: Women's Health

## 2016-12-19 ENCOUNTER — Other Ambulatory Visit: Payer: Self-pay | Admitting: Women's Health

## 2016-12-19 MED ORDER — LEVONORGESTREL-ETHINYL ESTRAD 0.1-20 MG-MCG PO TABS: 1.0000 | ORAL_TABLET | Freq: Every day | ORAL | 2 refills | Status: DC

## 2016-12-19 MED FILL — LEVONOR-ETH ESTRAD 0.1-0.02: 0.1-20 | 84 days supply | Qty: 84 | Fill #0

## 2017-01-14 DIAGNOSIS — N390 Urinary tract infection, site not specified: Secondary | ICD-10-CM | POA: Diagnosis not present

## 2017-01-15 DIAGNOSIS — N390 Urinary tract infection, site not specified: Secondary | ICD-10-CM | POA: Diagnosis not present

## 2017-02-22 ENCOUNTER — Encounter: Payer: Self-pay | Admitting: Women's Health

## 2017-03-09 MED FILL — LEVOTHYROXINE 75 MCG TABLET: 75 | 90 days supply | Qty: 90 | Fill #2

## 2017-04-04 DIAGNOSIS — D225 Melanocytic nevi of trunk: Secondary | ICD-10-CM | POA: Diagnosis not present

## 2017-04-04 DIAGNOSIS — D2262 Melanocytic nevi of left upper limb, including shoulder: Secondary | ICD-10-CM | POA: Diagnosis not present

## 2017-04-04 DIAGNOSIS — L821 Other seborrheic keratosis: Secondary | ICD-10-CM | POA: Diagnosis not present

## 2017-04-04 DIAGNOSIS — D1801 Hemangioma of skin and subcutaneous tissue: Secondary | ICD-10-CM | POA: Diagnosis not present

## 2017-04-04 DIAGNOSIS — D485 Neoplasm of uncertain behavior of skin: Secondary | ICD-10-CM | POA: Diagnosis not present

## 2017-04-04 DIAGNOSIS — D2261 Melanocytic nevi of right upper limb, including shoulder: Secondary | ICD-10-CM | POA: Diagnosis not present

## 2017-04-04 DIAGNOSIS — L811 Chloasma: Secondary | ICD-10-CM | POA: Diagnosis not present

## 2017-04-04 DIAGNOSIS — D2272 Melanocytic nevi of left lower limb, including hip: Secondary | ICD-10-CM | POA: Diagnosis not present

## 2017-04-04 DIAGNOSIS — L814 Other melanin hyperpigmentation: Secondary | ICD-10-CM | POA: Diagnosis not present

## 2017-04-04 DIAGNOSIS — D2271 Melanocytic nevi of right lower limb, including hip: Secondary | ICD-10-CM | POA: Diagnosis not present

## 2017-04-12 DIAGNOSIS — H1131 Conjunctival hemorrhage, right eye: Secondary | ICD-10-CM | POA: Diagnosis not present

## 2017-06-12 MED FILL — LEVOTHYROXINE 75 MCG TABLET: 75 | 90 days supply | Qty: 90 | Fill #3

## 2017-06-27 ENCOUNTER — Encounter: Payer: Self-pay | Admitting: Women's Health

## 2017-06-27 ENCOUNTER — Ambulatory Visit (INDEPENDENT_AMBULATORY_CARE_PROVIDER_SITE_OTHER): Payer: 59 | Admitting: Women's Health

## 2017-06-27 VITALS — BP 122/80 | Ht 67.0 in | Wt 188.0 lb

## 2017-06-27 DIAGNOSIS — E038 Other specified hypothyroidism: Secondary | ICD-10-CM | POA: Diagnosis not present

## 2017-06-27 DIAGNOSIS — Z13220 Encounter for screening for lipoid disorders: Secondary | ICD-10-CM | POA: Diagnosis not present

## 2017-06-27 DIAGNOSIS — Z01419 Encounter for gynecological examination (general) (routine) without abnormal findings: Secondary | ICD-10-CM

## 2017-06-27 LAB — CBC WITH DIFFERENTIAL/PLATELET
BASOS PCT: 0.3 %
Basophils Absolute: 23 cells/uL (ref 0–200)
Eosinophils Absolute: 203 cells/uL (ref 15–500)
Eosinophils Relative: 2.6 %
HCT: 40.8 % (ref 35.0–45.0)
Hemoglobin: 13.9 g/dL (ref 11.7–15.5)
Lymphs Abs: 1700 cells/uL (ref 850–3900)
MCH: 30.6 pg (ref 27.0–33.0)
MCHC: 34.1 g/dL (ref 32.0–36.0)
MCV: 89.9 fL (ref 80.0–100.0)
MONOS PCT: 6 %
MPV: 10.5 fL (ref 7.5–12.5)
Neutro Abs: 5405 cells/uL (ref 1500–7800)
Neutrophils Relative %: 69.3 %
PLATELETS: 295 10*3/uL (ref 140–400)
RBC: 4.54 10*6/uL (ref 3.80–5.10)
RDW: 12.4 % (ref 11.0–15.0)
TOTAL LYMPHOCYTE: 21.8 %
WBC mixed population: 468 cells/uL (ref 200–950)
WBC: 7.8 10*3/uL (ref 3.8–10.8)

## 2017-06-27 LAB — COMPREHENSIVE METABOLIC PANEL
AG Ratio: 1.7 (calc) (ref 1.0–2.5)
ALBUMIN MSPROF: 4.4 g/dL (ref 3.6–5.1)
ALT: 24 U/L (ref 6–29)
AST: 21 U/L (ref 10–35)
Alkaline phosphatase (APISO): 88 U/L (ref 33–115)
BUN: 17 mg/dL (ref 7–25)
CHLORIDE: 104 mmol/L (ref 98–110)
CO2: 24 mmol/L (ref 20–32)
CREATININE: 0.9 mg/dL (ref 0.50–1.10)
Calcium: 9.5 mg/dL (ref 8.6–10.2)
GLOBULIN: 2.6 g/dL (ref 1.9–3.7)
GLUCOSE: 87 mg/dL (ref 65–99)
Potassium: 4.3 mmol/L (ref 3.5–5.3)
Sodium: 138 mmol/L (ref 135–146)
Total Bilirubin: 1.2 mg/dL (ref 0.2–1.2)
Total Protein: 7 g/dL (ref 6.1–8.1)

## 2017-06-27 LAB — LIPID PANEL
CHOL/HDL RATIO: 3 (calc) (ref <5.0)
CHOLESTEROL: 181 mg/dL (ref <200)
HDL: 60 mg/dL (ref >50)
LDL CHOLESTEROL (CALC): 106 mg/dL — AB
Non-HDL Cholesterol (Calc): 121 mg/dL (calc) (ref <130)
TRIGLYCERIDES: 68 mg/dL (ref <150)

## 2017-06-27 LAB — TSH: TSH: 1.51 mIU/L

## 2017-06-27 MED ORDER — LO LOESTRIN FE 1 MG-10 MCG / 10 MCG PO TABS: 1.0000 | ORAL_TABLET | Freq: Every day | ORAL | 4 refills | Status: DC

## 2017-06-27 NOTE — Patient Instructions (Signed)
Menorrhagia Menorrhagia is a condition in which menstrual periods are heavy or last longer than normal. With menorrhagia, most periods a woman has may cause enough blood loss and cramping that she becomes unable to take part in her usual activities. What are the causes? Common causes of this condition include:  Noncancerous growths in the uterus (polyps or fibroids).  An imbalance of the estrogen and progesterone hormones.  One of the ovaries not releasing an egg during one or more months.  A problem with the thyroid gland (hypothyroid).  Side effects of having an intrauterine device (IUD).  Side effects of some medicines, such as anti-inflammatory medicines or blood thinners.  A bleeding disorder that stops the blood from clotting normally.  In some cases, the cause of this condition is not known. What are the signs or symptoms? Symptoms of this condition include:  Routinely having to change your pad or tampon every 1-2 hours because it is completely soaked.  Needing to use pads and tampons at the same time because of heavy bleeding.  Needing to wake up to change your pads or tampons during the night.  Passing blood clots larger than 1 inch (2.5 cm) in size.  Having bleeding that lasts for more than 7 days.  Having symptoms of low iron levels (anemia), such as tiredness, fatigue, or shortness of breath.  How is this diagnosed? This condition may be diagnosed based on:  A physical exam.  Your symptoms and menstrual history.  Tests, such as: ? Blood tests to check if you are pregnant or have hormonal changes, a bleeding or thyroid disorder, anemia, or other problems. ? Pap test to check for cancerous changes, infections, or inflammation. ? Endometrial biopsy. This test involves removing a tissue sample from the lining of the uterus (endometrium) to be examined under a microscope. ? Pelvic ultrasound. This test uses sound waves to create images of your uterus, ovaries, and  vagina. The images can show if you have fibroids or other growths. ? Hysteroscopy. For this test, a small telescope is used to look inside your uterus.  How is this treated? Treatment may not be needed for this condition. If it is needed, the best treatment for you will depend on:  Whether you need to prevent pregnancy.  Your desire to have children in the future.  The cause and severity of your bleeding.  Your personal preference.  Medicines are the first step in treatment. You may be treated with:  Hormonal birth control methods. These treatments reduce bleeding during your menstrual period. They include: ? Birth control pills. ? Skin patch. ? Vaginal ring. ? Shots (injections) that you get every 3 months. ? Hormonal IUD (intrauterine device). ? Implants that go under the skin.  Medicines that thicken blood and slow bleeding.  Medicines that reduce swelling, such as ibuprofen.  Medicines that contain an artificial (synthetic) hormone called progestin.  Medicines that make the ovaries stop working for a short time.  Iron supplements to treat anemia.  If medicines do not work, surgery may be done. Surgical options may include:  Dilation and curettage (D&C). In this procedure, your health care provider opens (dilates) your cervix and then scrapes or suctions tissue from the endometrium to reduce menstrual bleeding.  Operative hysteroscopy. In this procedure, a small tube with a light on the end (hysteroscope) is used to view your uterus and help remove polyps that may be causing heavy periods.  Endometrial ablation. This is when various techniques are used to permanently   destroy your entire endometrium. After endometrial ablation, most women have little or no menstrual flow. This procedure reduces your ability to become pregnant.  Endometrial resection. In this procedure, an electrosurgical wire loop is used to remove the endometrium. This procedure reduces your ability to  become pregnant.  Hysterectomy. This is surgical removal of the uterus. This is a permanent procedure that stops menstrual periods. Pregnancy is not possible after a hysterectomy.  Follow these instructions at home: Medicines  Take over-the-counter and prescription medicines exactly as told by your health care provider. This includes iron pills.  Do not change or switch medicines without asking your health care provider.  Do not take aspirin or medicines that contain aspirin 1 week before or during your menstrual period. Aspirin may make bleeding worse. General instructions  If you need to change your sanitary pad or tampon more than once every 2 hours, limit your activity until the bleeding stops.  Iron pills can cause constipation. To prevent or treat constipation while you are taking prescription iron supplements, your health care provider may recommend that you: ? Drink enough fluid to keep your urine clear or pale yellow. ? Take over-the-counter or prescription medicines. ? Eat foods that are high in fiber, such as fresh fruits and vegetables, whole grains, and beans. ? Limit foods that are high in fat and processed sugars, such as fried and sweet foods.  Eat well-balanced meals, including foods that are high in iron. Foods that have a lot of iron include leafy green vegetables, meat, liver, eggs, and whole grain breads and cereals.  Do not try to lose weight until the abnormal bleeding has stopped and your blood iron level is back to normal. If you need to lose weight, work with your health care provider to lose weight safely.  Keep all follow-up visits as told by your health care provider. This is important. Contact a health care provider if:  You soak through a pad or tampon every 1 or 2 hours, and this happens every time you have a period.  You need to use pads and tampons at the same time because you are bleeding so much.  You have nausea, vomiting, diarrhea, or other  problems related to medicines you are taking. Get help right away if:  You soak through more than a pad or tampon in 1 hour.  You pass clots bigger than 1 inch (2.5 cm) wide.  You feel short of breath.  You feel like your heart is beating too fast.  You feel dizzy or faint.  You feel very weak or tired. Summary  Menorrhagia is a condition in which menstrual periods are heavy or last longer than normal.  Treatment will depend on the cause of the condition and may include medicines or procedures.  Take over-the-counter and prescription medicines exactly as told by your health care provider. This includes iron pills.  Get help right away if you have heavy bleeding that soaks through more than a pad or tampon in 1 hour, you are passing large clots, or you feel dizzy, faint or short of breath. This information is not intended to replace advice given to you by your health care provider. Make sure you discuss any questions you have with your health care provider. Document Released: 07/17/2005 Document Revised: 07/10/2016 Document Reviewed: 07/10/2016 Elsevier Interactive Patient Education  2017 [place] Maintenance, Female Adopting a healthy lifestyle and getting preventive care can go a long way to promote health and wellness. Talk with your  health care provider about what schedule of regular examinations is right for you. This is a good chance for you to check in with your provider about disease prevention and staying healthy. In between checkups, there are plenty of things you can do on your own. Experts have done a lot of research about which lifestyle changes and preventive measures are most likely to keep you healthy. Ask your health care provider for more information. Weight and diet Eat a healthy diet  Be sure to include plenty of vegetables, fruits, low-fat dairy products, and lean protein.  Do not eat a lot of foods high in solid fats, added sugars, or salt.  Get  regular exercise. This is one of the most important things you can do for your health. ? Most adults should exercise for at least 150 minutes each week. The exercise should increase your heart rate and make you sweat (moderate-intensity exercise). ? Most adults should also do strengthening exercises at least twice a week. This is in addition to the moderate-intensity exercise.  Maintain a healthy weight  Body mass index (BMI) is a measurement that can be used to identify possible weight problems. It estimates body fat based on height and weight. Your health care provider can help determine your BMI and help you achieve or maintain a healthy weight.  For females 89 years of age and older: ? A BMI below 18.5 is considered underweight. ? A BMI of 18.5 to 24.9 is normal. ? A BMI of 25 to 29.9 is considered overweight. ? A BMI of 30 and above is considered obese.  Watch levels of cholesterol and blood lipids  You should start having your blood tested for lipids and cholesterol at 49 years of age, then have this test every 5 years.  You may need to have your cholesterol levels checked more often if: ? Your lipid or cholesterol levels are high. ? You are older than 49 years of age. ? You are at high risk for heart disease.  Cancer screening Lung Cancer  Lung cancer screening is recommended for adults 106-79 years old who are at high risk for lung cancer because of a history of smoking.  A yearly low-dose CT scan of the lungs is recommended for people who: ? Currently smoke. ? Have quit within the past 15 years. ? Have at least a 30-pack-year history of smoking. A pack year is smoking an average of one pack of cigarettes a day for 1 year.  Yearly screening should continue until it has been 15 years since you quit.  Yearly screening should stop if you develop a health problem that would prevent you from having lung cancer treatment.  Breast Cancer  Practice breast self-awareness. This  means understanding how your breasts normally appear and feel.  It also means doing regular breast self-exams. Let your health care provider know about any changes, no matter how small.  If you are in your 20s or 30s, you should have a clinical breast exam (CBE) by a health care provider every 1-3 years as part of a regular health exam.  If you are 78 or older, have a CBE every year. Also consider having a breast X-ray (mammogram) every year.  If you have a family history of breast cancer, talk to your health care provider about genetic screening.  If you are at high risk for breast cancer, talk to your health care provider about having an MRI and a mammogram every year.  Breast cancer gene (BRCA)  assessment is recommended for women who have family members with BRCA-related cancers. BRCA-related cancers include: ? Breast. ? Ovarian. ? Tubal. ? Peritoneal cancers.  Results of the assessment will determine the need for genetic counseling and BRCA1 and BRCA2 testing.  Cervical Cancer Your health care provider may recommend that you be screened regularly for cancer of the pelvic organs (ovaries, uterus, and vagina). This screening involves a pelvic examination, including checking for microscopic changes to the surface of your cervix (Pap test). You may be encouraged to have this screening done every 3 years, beginning at age 21.  For women ages 84-65, health care providers may recommend pelvic exams and Pap testing every 3 years, or they may recommend the Pap and pelvic exam, combined with testing for human papilloma virus (HPV), every 5 years. Some types of HPV increase your risk of cervical cancer. Testing for HPV may also be done on women of any age with unclear Pap test results.  Other health care providers may not recommend any screening for nonpregnant women who are considered low risk for pelvic cancer and who do not have symptoms. Ask your health care provider if a screening pelvic exam  is right for you.  If you have had past treatment for cervical cancer or a condition that could lead to cancer, you need Pap tests and screening for cancer for at least 20 years after your treatment. If Pap tests have been discontinued, your risk factors (such as having a new sexual partner) need to be reassessed to determine if screening should resume. Some women have medical problems that increase the chance of getting cervical cancer. In these cases, your health care provider may recommend more frequent screening and Pap tests.  Colorectal Cancer  This type of cancer can be detected and often prevented.  Routine colorectal cancer screening usually begins at 49 years of age and continues through 49 years of age.  Your health care provider may recommend screening at an earlier age if you have risk factors for colon cancer.  Your health care provider may also recommend using home test kits to check for hidden blood in the stool.  A small camera at the end of a tube can be used to examine your colon directly (sigmoidoscopy or colonoscopy). This is done to check for the earliest forms of colorectal cancer.  Routine screening usually begins at age 69.  Direct examination of the colon should be repeated every 5-10 years through 49 years of age. However, you may need to be screened more often if early forms of precancerous polyps or small growths are found.  Skin Cancer  Check your skin from head to toe regularly.  Tell your health care provider about any new moles or changes in moles, especially if there is a change in a mole's shape or color.  Also tell your health care provider if you have a mole that is larger than the size of a pencil eraser.  Always use sunscreen. Apply sunscreen liberally and repeatedly throughout the day.  Protect yourself by wearing long sleeves, pants, a wide-brimmed hat, and sunglasses whenever you are outside.  Heart disease, diabetes, and high blood  pressure  High blood pressure causes heart disease and increases the risk of stroke. High blood pressure is more likely to develop in: ? People who have blood pressure in the high end of the normal range (130-139/85-89 mm Hg). ? People who are overweight or obese. ? People who are African American.  If you are  79-52 years of age, have your blood pressure checked every 3-5 years. If you are 20 years of age or older, have your blood pressure checked every year. You should have your blood pressure measured twice-once when you are at a hospital or clinic, and once when you are not at a hospital or clinic. Record the average of the two measurements. To check your blood pressure when you are not at a hospital or clinic, you can use: ? An automated blood pressure machine at a pharmacy. ? A home blood pressure monitor.  If you are between 54 years and 62 years old, ask your health care provider if you should take aspirin to prevent strokes.  Have regular diabetes screenings. This involves taking a blood sample to check your fasting blood sugar level. ? If you are at a normal weight and have a low risk for diabetes, have this test once every three years after 49 years of age. ? If you are overweight and have a high risk for diabetes, consider being tested at a younger age or more often. Preventing infection Hepatitis B  If you have a higher risk for hepatitis B, you should be screened for this virus. You are considered at high risk for hepatitis B if: ? You were born in a country where hepatitis B is common. Ask your health care provider which countries are considered high risk. ? Your parents were born in a high-risk country, and you have not been immunized against hepatitis B (hepatitis B vaccine). ? You have HIV or AIDS. ? You use needles to inject street drugs. ? You live with someone who has hepatitis B. ? You have had sex with someone who has hepatitis B. ? You get hemodialysis  treatment. ? You take certain medicines for conditions, including cancer, organ transplantation, and autoimmune conditions.  Hepatitis C  Blood testing is recommended for: ? Everyone born from [birth year] through 1963. ? Anyone with known risk factors for hepatitis C.  Sexually transmitted infections (STIs)  You should be screened for sexually transmitted infections (STIs) including gonorrhea and chlamydia if: ? You are sexually active and are younger than 49 years of age. ? You are older than 49 years of age and your health care provider tells you that you are at risk for this type of infection. ? Your sexual activity has changed since you were last screened and you are at an increased risk for chlamydia or gonorrhea. Ask your health care provider if you are at risk.  If you do not have HIV, but are at risk, it may be recommended that you take a prescription medicine daily to prevent HIV infection. This is called pre-exposure prophylaxis (PrEP). You are considered at risk if: ? You are sexually active and do not regularly use condoms or know the HIV status of your partner(s). ? You take drugs by injection. ? You are sexually active with a partner who has HIV.  Talk with your health care provider about whether you are at high risk of being infected with HIV. If you choose to begin PrEP, you should first be tested for HIV. You should then be tested every 3 months for as long as you are taking PrEP. Pregnancy  If you are premenopausal and you may become pregnant, ask your health care provider about preconception counseling.  If you may become pregnant, take 400 to 800 micrograms (mcg) of folic acid every day.  If you want to prevent pregnancy, talk to your health care  provider about birth control (contraception). Osteoporosis and menopause  Osteoporosis is a disease in which the bones lose minerals and strength with aging. This can result in serious bone fractures. Your risk for osteoporosis  can be identified using a bone density scan.  If you are 46 years of age or older, or if you are at risk for osteoporosis and fractures, ask your health care provider if you should be screened.  Ask your health care provider whether you should take a calcium or vitamin D supplement to lower your risk for osteoporosis.  Menopause may have certain physical symptoms and risks.  Hormone replacement therapy may reduce some of these symptoms and risks. Talk to your health care provider about whether hormone replacement therapy is right for you. Follow these instructions at home:  Schedule regular health, dental, and eye exams.  Stay current with your immunizations.  Do not use any tobacco products including cigarettes, chewing tobacco, or electronic cigarettes.  If you are pregnant, do not drink alcohol.  If you are breastfeeding, limit how much and how often you drink alcohol.  Limit alcohol intake to no more than 1 drink per day for nonpregnant women. One drink equals 12 ounces of beer, 5 ounces of wine, or 1 ounces of hard liquor.  Do not use street drugs.  Do not share needles.  Ask your health care provider for help if you need support or information about quitting drugs.  Tell your health care provider if you often feel depressed.  Tell your health care provider if you have ever been abused or do not feel safe at home. This information is not intended to replace advice given to you by your health care provider. Make sure you discuss any questions you have with your health care provider. Document Released: 01/30/2011 Document Revised: 12/23/2015 Document Reviewed: 04/20/2015 Elsevier Interactive Patient Education  [employer].

## 2017-06-27 NOTE — Progress Notes (Signed)
Kristen Mccarty 23-Aug-1965 [id number]    History:    Presents for annual exam.  Monthly cycle, heavy flow, previously on OCPs with improved menorrhagia, stopped taking 3 months ago due to sleepiness that she related to OC's. 2004 BTL. Normal Pap and mammogram history.  Hypothyroidism, taking Synthroid.  Past medical history, past surgical history, family history and social history were all reviewed and documented in the EPIC chart. Walks every day, plays tennis 3 times a week. Husband, psychiatrist, in good health. Son 49, freshman in high school. Daughter 53, sophomore at [employer], premed major. Son 21, senior at [employer].  ROS:  A ROS was performed and pertinent positives and negatives are included.  Exam:  Vitals:   06/27/17 0935  BP: 122/80  Weight: 188 lb (85.3 kg)  Height: 5\' 7"  (1.702 m)   Body mass index is 29.44 kg/m.   General appearance:  Normal Thyroid:  Symmetrical, normal in size, without palpable masses or nodularity. Respiratory  Auscultation:  Clear without wheezing or rhonchi Cardiovascular  Auscultation:  Regular rate, without rubs, murmurs or gallops  Edema/varicosities:  Not grossly evident Abdominal  Soft,nontender, without masses, guarding or rebound.  Liver/spleen:  No organomegaly noted  Hernia:  None appreciated  Skin  Inspection:  Grossly normal   Breasts: Examined lying and sitting.     Right: Without masses, retractions, discharge or axillary adenopathy.     Left: Without masses, retractions, discharge or axillary adenopathy. Gentitourinary   Inguinal/mons:  Normal without inguinal adenopathy  External genitalia:  Normal  BUS/Urethra/Skene's glands:  Normal  Vagina:  Normal  Cervix:  Normal  Uterus:  Normal in size, shape and contour.  Midline and mobile  Adnexa/parametria:     Rt: Without masses or tenderness.   Lt: Without masses or tenderness.  Anus and perineum: Normal  Digital rectal exam: Normal sphincter tone without palpated  masses or tenderness  Assessment/Plan:  51 y.o.  MWF G3P3 presents for annual exam with complaint of menorrhagia.  Regular monthly cycle/BTL/Menorrhagia Hypothyroidism  Plan: Options discussed, Loestrin 10 g, reviewed proper use and slight risk for blood clots and stroke, will call our RTO if unable to tolerate due to sleepiness. Synthroid 75 g by mouth daily prescription, proper use given and reviewed. SBE's, annual screening mammogram . Discussed scheduling a screening colonoscopy next year. Continue active lifestyle with aerobic exercise and healthy diet. Encouraged calcium rich foods, vitamin D 1000 daily. CBC with differential, CMP, lipid panel, TSH, UA with culture. Pap normal with negative high-risk HPV 2017, new screening guidelines reviewed.   [place], 10:16 AM 06/27/2017

## 2017-06-28 ENCOUNTER — Encounter: Payer: Self-pay | Admitting: Women's Health

## 2017-06-28 ENCOUNTER — Ambulatory Visit: Payer: 59 | Admitting: Women's Health

## 2017-06-28 LAB — URINALYSIS W MICROSCOPIC + REFLEX CULTURE
BILIRUBIN URINE: NEGATIVE
Bacteria, UA: NONE SEEN /HPF
Glucose, UA: NEGATIVE
Hgb urine dipstick: NEGATIVE
Hyaline Cast: NONE SEEN /LPF
KETONES UR: NEGATIVE
LEUKOCYTE ESTERASE: NEGATIVE
NITRITES URINE, INITIAL: NEGATIVE
PH: 7 (ref 5.0–8.0)
Protein, ur: NEGATIVE
SPECIFIC GRAVITY, URINE: 1.024 (ref 1.001–1.03)
Squamous Epithelial / LPF: NONE SEEN /HPF (ref <=5)
WBC, UA: NONE SEEN /HPF (ref 0–5)

## 2017-06-28 LAB — NO CULTURE INDICATED

## 2017-06-28 MED FILL — LO LOESTRIN FE 1-10 TABLET: 1 MG-10 MCG | 84 days supply | Qty: 84 | Fill #0

## 2017-06-29 ENCOUNTER — Telehealth: Payer: Self-pay | Admitting: *Deleted

## 2017-06-29 NOTE — Telephone Encounter (Signed)
Prior Authorization done via cover my meds for lo Loestrin fe 1-10 approved by [employer] long pharmacy informed as well.

## 2017-07-16 ENCOUNTER — Other Ambulatory Visit: Payer: Self-pay | Admitting: Women's Health

## 2017-07-16 DIAGNOSIS — Z1231 Encounter for screening mammogram for malignant neoplasm of breast: Secondary | ICD-10-CM

## 2017-08-02 ENCOUNTER — Encounter: Payer: Self-pay | Admitting: Women's Health

## 2017-08-15 ENCOUNTER — Encounter: Payer: Self-pay | Admitting: Women's Health

## 2017-08-15 ENCOUNTER — Ambulatory Visit
Admission: RE | Admit: 2017-08-15 | Discharge: 2017-08-15 | Disposition: A | Discharge status: Home / Self Care | Payer: 59 | Source: Ambulatory Visit | Attending: Women's Health | Admitting: Women's Health

## 2017-08-15 DIAGNOSIS — Z1231 Encounter for screening mammogram for malignant neoplasm of breast: Secondary | ICD-10-CM

## 2017-09-06 ENCOUNTER — Encounter: Payer: Self-pay | Admitting: Women's Health

## 2017-09-12 ENCOUNTER — Other Ambulatory Visit: Payer: Self-pay | Admitting: Women's Health

## 2017-09-12 ENCOUNTER — Encounter: Payer: Self-pay | Admitting: Women's Health

## 2017-09-12 ENCOUNTER — Ambulatory Visit: Payer: 59 | Admitting: Women's Health

## 2017-09-12 VITALS — BP 130/80

## 2017-09-12 DIAGNOSIS — E038 Other specified hypothyroidism: Secondary | ICD-10-CM

## 2017-09-12 DIAGNOSIS — F3289 Other specified depressive episodes: Secondary | ICD-10-CM

## 2017-09-12 MED ORDER — FLUOXETINE HCL 10 MG PO CAPS: 10.0000 mg | ORAL_CAPSULE | Freq: Every day | ORAL | 6 refills | Status: DC

## 2017-09-12 MED FILL — FLUoxetine HCL 10 MG CAPS: 10 | 30 days supply | Qty: 30 | Fill #0

## 2017-09-12 MED FILL — LEVOTHYROXINE 75 MCG TABLET: 75 | 90 days supply | Qty: 90 | Fill #0

## 2017-09-12 NOTE — Patient Instructions (Signed)
Fluoxetine capsules or tablets (Depression/Mood Disorders) What is this medicine? FLUOXETINE (floo OX e teen) belongs to a class of drugs known as selective serotonin reuptake inhibitors (SSRIs). It helps to treat mood problems such as depression, obsessive compulsive disorder, and panic attacks. It can also treat certain eating disorders. This medicine may be used for other purposes; ask your health care provider or pharmacist if you have questions. COMMON BRAND NAME(S): Prozac What should I tell my health care provider before I take this medicine? They need to know if you have any of these conditions: -bipolar disorder or a family history of bipolar disorder -bleeding disorders -glaucoma -heart disease -liver disease -low levels of sodium in the blood -seizures -suicidal thoughts, plans, or attempt; a previous suicide attempt by you or a family member -take MAOIs like Carbex, Eldepryl, Marplan, Nardil, and Parnate -take medicines that treat or prevent blood clots -thyroid disease -an unusual or allergic reaction to fluoxetine, other medicines, foods, dyes, or preservatives -pregnant or trying to get pregnant -breast-feeding How should I use this medicine? Take this medicine by mouth with a glass of water. Follow the directions on the prescription label. You can take this medicine with or without food. Take your medicine at regular intervals. Do not take it more often than directed. Do not stop taking this medicine suddenly except upon the advice of your doctor. Stopping this medicine too quickly may cause serious side effects or your condition may worsen. A special MedGuide will be given to you by the pharmacist with each prescription and refill. Be sure to read this information carefully each time. Talk to your pediatrician regarding the use of this medicine in children. While this drug may be prescribed for children as Jeromey Kruer as 7 years for selected conditions, precautions do  apply. Overdosage: If you think you have taken too much of this medicine contact a poison control center or emergency room at once. NOTE: This medicine is only for you. Do not share this medicine with others. What if I miss a dose? If you miss a dose, skip the missed dose and go back to your regular dosing schedule. Do not take double or extra doses. What may interact with this medicine? Do not take this medicine with any of the following medications: -other medicines containing fluoxetine, like Sarafem or Symbyax -cisapride -linezolid -MAOIs like Carbex, Eldepryl, Marplan, Nardil, and Parnate -methylene blue (injected into a vein) -pimozide -thioridazine This medicine may also interact with the following medications: -alcohol -amphetamines -aspirin and aspirin-like medicines -carbamazepine -certain medicines for depression, anxiety, or psychotic disturbances -certain medicines for migraine headaches like almotriptan, eletriptan, frovatriptan, naratriptan, rizatriptan, sumatriptan, zolmitriptan -digoxin -diuretics -fentanyl -flecainide -furazolidone -isoniazid -lithium -medicines for sleep -medicines that treat or prevent blood clots like warfarin, enoxaparin, and dalteparin -NSAIDs, medicines for pain and inflammation, like ibuprofen or naproxen -phenytoin -procarbazine -propafenone -rasagiline -ritonavir -supplements like St. John's wort, kava kava, valerian -tramadol -tryptophan -vinblastine This list may not describe all possible interactions. Give your health care provider a list of all the medicines, herbs, non-prescription drugs, or dietary supplements you use. Also tell them if you smoke, drink alcohol, or use illegal drugs. Some items may interact with your medicine. What should I watch for while using this medicine? Tell your doctor if your symptoms do not get better or if they get worse. Visit your doctor or health care professional for regular checks on your  progress. Because it may take several weeks to see the full effects of this medicine, it   is important to continue your treatment as prescribed by your doctor. Patients and their families should watch out for new or worsening thoughts of suicide or depression. Also watch out for sudden changes in feelings such as feeling anxious, agitated, panicky, irritable, hostile, aggressive, impulsive, severely restless, overly excited and hyperactive, or not being able to sleep. If this happens, especially at the beginning of treatment or after a change in dose, call your health care professional. You may get drowsy or dizzy. Do not drive, use machinery, or do anything that needs mental alertness until you know how this medicine affects you. Do not stand or sit up quickly, especially if you are an older patient. This reduces the risk of dizzy or fainting spells. Alcohol may interfere with the effect of this medicine. Avoid alcoholic drinks. Your mouth may get dry. Chewing sugarless gum or sucking hard candy, and drinking plenty of water may help. Contact your doctor if the problem does not go away or is severe. This medicine may affect blood sugar levels. If you have diabetes, check with your doctor or health care professional before you change your diet or the dose of your diabetic medicine. What side effects may I notice from receiving this medicine? Side effects that you should report to your doctor or health care professional as soon as possible: -allergic reactions like skin rash, itching or hives, swelling of the face, lips, or tongue -anxious -black, tarry stools -breathing problems -changes in vision -confusion -elevated mood, decreased need for sleep, racing thoughts, impulsive behavior -eye pain -fast, irregular heartbeat -feeling faint or lightheaded, falls -feeling agitated, angry, or irritable -hallucination, loss of contact with reality -loss of balance or coordination -loss of memory -painful  or prolonged erections -restlessness, pacing, inability to keep still -seizures -stiff muscles -suicidal thoughts or other mood changes -trouble sleeping -unusual bleeding or bruising -unusually weak or tired -vomiting Side effects that usually do not require medical attention (report to your doctor or health care professional if they continue or are bothersome): -change in appetite or weight -change in sex drive or performance -diarrhea -dry mouth -headache -increased sweating -nausea -tremors This list may not describe all possible side effects. Call your doctor for medical advice about side effects. You may report side effects to FDA at 1-800-FDA-1088. Where should I keep my medicine? Keep out of the reach of children. Store at room temperature between 15 and 30 degrees C (59 and 86 degrees F). Throw away any unused medicine after the expiration date. NOTE: This sheet is a summary. It may not cover all possible information. If you have questions about this medicine, talk to your doctor, pharmacist, or health care provider.  2018 Elsevier/Gold Standard (2015-12-18 15:55:27)  

## 2017-09-12 NOTE — Progress Notes (Signed)
52 year old MHF G3 P3 presents with complaint of difficulty sleeping, waking at 3 AM most days and unable to fall asleep again, depression, sadness, easily crying for no reason. Husband psychiatrist. Has 3 children all doing well 2 in college one in high school. Previously was a Pharmacist, community in [place], has gone back to school for accounting but has not been able to find part-time work. States is aware how lucky she is but states still cries easily. States does not want to see a psychiatrist but would be willing to see a therapist. No feelings of self-harm. Monthly cycle on lo Loestrin.  Exam: Appears well, well-dressed and groomed. Good eye contact, able to articulate feelings well.  Depression.  Plan: Options reviewed, will try Prozac 10 mg by mouth daily, reviewed it is a low dose may need to increase dosage. Reviewed importance of continuing leisure activities of playing tennis, friendships. Almyra Free Whitt'st name and number given for counseling. Instructed to call back if she feels that med needs to be increased after a month or 2.

## 2017-09-25 MED FILL — LO LOESTRIN FE 1-10 TABLET: 1 MG-10 MCG | 84 days supply | Qty: 84 | Fill #1

## 2017-10-15 ENCOUNTER — Encounter: Payer: Self-pay | Admitting: Internal Medicine

## 2017-11-26 ENCOUNTER — Encounter: Payer: Self-pay | Admitting: Women's Health

## 2017-11-26 NOTE — Telephone Encounter (Signed)
This can wait for Kristen Mccarty

## 2017-12-03 ENCOUNTER — Other Ambulatory Visit: Payer: Self-pay | Admitting: Women's Health

## 2017-12-03 MED ORDER — DULOXETINE HCL 20 MG PO CPEP
20.0000 mg | ORAL_CAPSULE | Freq: Every day | ORAL | 0 refills | Status: DC
Start: 2017-12-03 — End: 2018-08-27

## 2017-12-04 MED FILL — DULoxetine HCL 20 MG CPEP: 20 | 90 days supply | Qty: 90 | Fill #0

## 2017-12-17 MED FILL — LO LOESTRIN FE 1-10 TABLET: 1 MG-10 MCG | 84 days supply | Qty: 84 | Fill #2

## 2018-01-18 MED FILL — LEVOTHYROXINE 75 MCG TABLET: 75 | 90 days supply | Qty: 90 | Fill #1

## 2018-03-02 ENCOUNTER — Telehealth: Payer: Self-pay | Admitting: Obstetrics & Gynecology

## 2018-03-02 MED ORDER — SULFAMETHOXAZOLE-TRIMETHOPRIM 800-160 MG PO TABS: 1.0000 | ORAL_TABLET | Freq: Two times a day (BID) | ORAL | 0 refills | Status: DC

## 2018-03-02 NOTE — Telephone Encounter (Signed)
Pt called on-call service with complaint of urinary track infection symptoms.  Attempted call to pt multiple times.  Pt eventually called back through the answering service.  She is having increased urinary frequency, urgency, hesitancy.  Denies hematuria, fever, back pain.    She requested ciprofloxin.  I reviewed c diff colitis risks with pt and rx for bactrim DS BID x 3 days sent to pharmacy.

## 2018-03-05 NOTE — Telephone Encounter (Signed)
TC for follow up, states much better, no fever

## 2018-03-14 MED FILL — LO LOESTRIN FE 1-10 TABLET: 1 MG-10 MCG | 84 days supply | Qty: 84 | Fill #3

## 2018-04-10 DIAGNOSIS — D2262 Melanocytic nevi of left upper limb, including shoulder: Secondary | ICD-10-CM | POA: Diagnosis not present

## 2018-04-10 DIAGNOSIS — L821 Other seborrheic keratosis: Secondary | ICD-10-CM | POA: Diagnosis not present

## 2018-04-10 DIAGNOSIS — D225 Melanocytic nevi of trunk: Secondary | ICD-10-CM | POA: Diagnosis not present

## 2018-04-10 DIAGNOSIS — L720 Epidermal cyst: Secondary | ICD-10-CM | POA: Diagnosis not present

## 2018-04-10 DIAGNOSIS — D2271 Melanocytic nevi of right lower limb, including hip: Secondary | ICD-10-CM | POA: Diagnosis not present

## 2018-04-10 DIAGNOSIS — L905 Scar conditions and fibrosis of skin: Secondary | ICD-10-CM | POA: Diagnosis not present

## 2018-04-10 DIAGNOSIS — D2272 Melanocytic nevi of left lower limb, including hip: Secondary | ICD-10-CM | POA: Diagnosis not present

## 2018-04-10 DIAGNOSIS — D2261 Melanocytic nevi of right upper limb, including shoulder: Secondary | ICD-10-CM | POA: Diagnosis not present

## 2018-04-10 DIAGNOSIS — L2089 Other atopic dermatitis: Secondary | ICD-10-CM | POA: Diagnosis not present

## 2018-05-28 MED FILL — LEVOTHYROXINE 75 MCG TABLET: 75 | 90 days supply | Qty: 90 | Fill #2

## 2018-07-03 ENCOUNTER — Encounter: Payer: 59 | Admitting: Women's Health

## 2018-08-09 DIAGNOSIS — H52223 Regular astigmatism, bilateral: Secondary | ICD-10-CM | POA: Diagnosis not present

## 2018-08-09 DIAGNOSIS — H524 Presbyopia: Secondary | ICD-10-CM | POA: Diagnosis not present

## 2018-08-19 DIAGNOSIS — D485 Neoplasm of uncertain behavior of skin: Secondary | ICD-10-CM | POA: Diagnosis not present

## 2018-08-19 DIAGNOSIS — L820 Inflamed seborrheic keratosis: Secondary | ICD-10-CM | POA: Diagnosis not present

## 2018-08-27 ENCOUNTER — Other Ambulatory Visit: Payer: Self-pay | Admitting: Women's Health

## 2018-08-27 ENCOUNTER — Encounter: Payer: Self-pay | Admitting: Women's Health

## 2018-08-27 ENCOUNTER — Ambulatory Visit (INDEPENDENT_AMBULATORY_CARE_PROVIDER_SITE_OTHER): Payer: 59 | Admitting: Women's Health

## 2018-08-27 VITALS — BP 118/76 | Ht 66.0 in | Wt 188.0 lb

## 2018-08-27 DIAGNOSIS — Z23 Encounter for immunization: Secondary | ICD-10-CM | POA: Diagnosis not present

## 2018-08-27 DIAGNOSIS — Z1231 Encounter for screening mammogram for malignant neoplasm of breast: Secondary | ICD-10-CM

## 2018-08-27 DIAGNOSIS — Z13220 Encounter for screening for lipoid disorders: Secondary | ICD-10-CM

## 2018-08-27 DIAGNOSIS — Z01419 Encounter for gynecological examination (general) (routine) without abnormal findings: Secondary | ICD-10-CM

## 2018-08-27 DIAGNOSIS — Z1151 Encounter for screening for human papillomavirus (HPV): Secondary | ICD-10-CM | POA: Diagnosis not present

## 2018-08-27 DIAGNOSIS — E038 Other specified hypothyroidism: Secondary | ICD-10-CM

## 2018-08-27 LAB — HM PAP SMEAR

## 2018-08-27 MED ORDER — LEVOTHYROXINE SODIUM 75 MCG PO TABS: ORAL_TABLET | ORAL | 3 refills | Status: DC

## 2018-08-27 MED ORDER — IBUPROFEN 600 MG PO TABS: 600.0000 mg | ORAL_TABLET | Freq: Three times a day (TID) | ORAL | 1 refills | Status: AC | PRN

## 2018-08-27 MED FILL — IBUPROFEN 600 MG TABLET: 600 | 20 days supply | Qty: 60 | Fill #0

## 2018-08-27 MED FILL — LEVOTHYROXINE 75 MCG TABLET: 75 | 90 days supply | Qty: 90 | Fill #0

## 2018-08-27 NOTE — Progress Notes (Signed)
Kristen Mccarty 05-Jan-1966 53 [id number]    History: 53 yo MHF, G3P3 presents for annual exam with no complaints. Heavy, irregular monthly cycles, did not have a cycle September or October 2019, has stopped using Loestrin since September 2019. Also stopped taking Cymbalta and Prozac 03/2018. Reports increased energy, better sleep and feeling better since discontinuing medication, reports no withdrawal symptoms. Hypothyroidism, taking Synthroid, simply started taking Synthroid at bedtime due to convenience and also thought it was helping her sleep. 2004 BTL. Normal Pap and mammogram history. Sexually active with husband.  Past medical history, past surgical history, family history and social history were all reviewed and documented in the EPIC chart.Plays tennis 3 times a week and attends [employer]. Husband, psychiatrist, in good health. Son 1, in high school, daughter [facility], eldest Son is an Garment/textile technologist in the [employer].   ROS:  A ROS was performed and pertinent positives and negatives are included.  Exam:  Vitals:   08/27/18 1028  BP: 118/76  Weight: 188 lb (85.3 kg)  Height: 5\' 6"  (1.676 m)   Body mass index is 30.34 kg/m.   General appearance: Obese Thyroid:  Symmetrical, normal in size, without palpable masses or nodularity. Respiratory  Auscultation:  Clear without wheezing or rhonchi Cardiovascular  Auscultation:  Regular rate, without rubs, murmurs or gallops  Edema/varicosities:  Not grossly evident Abdominal  Soft,nontender, without masses, guarding or rebound.  Liver/spleen:  No organomegaly noted  Hernia:  None appreciated  Skin  Inspection:  Grossly normal   Breasts: Examined lying and sitting.     Right: Without masses, retractions, discharge or axillary adenopathy.     Left: Without masses, retractions, discharge or axillary adenopathy. Gentitourinary   Inguinal/mons:  Normal without inguinal adenopathy  External genitalia:  Normal  BUS/Urethra/Skene's  glands:  Normal  Vagina:  Normal  Cervix:  Normal  Uterus:  normal in size, shape and contour.  Midline and mobile  Adnexa/parametria:     Rt: Without masses or tenderness.   Lt: Without masses or tenderness.  Anus and perineum: Normal  Digital rectal exam: Normal sphincter tone without palpated masses or tenderness  Assessment/Plan:  53 y.o.  MHF G3P3 presents for annual exam with no complaints for annual exam.    Perimenopausal/BTL Hypothyroidsim Obesity  Plan: Menopause reviewed, cycles becoming more irregular.  Continue Synthroid 75 g by mouth daily prescription, proper use given and reviewed.  Strongly encouraged to take Synthroid in a.m. and not to eat or drink for 30 minutes after taking. SBE's, annual screening mammogram . Discussed scheduling a screening colonoscopy this year [facility] information given instructed to schedule. Continue active lifestyle with aerobic exercise and healthy diet encouraged a yoga class as well.  Encouraged calcium rich foods, vitamin D 1000 daily. CBC with differential, CMP, lipid panel, TSH, Pap, with HR HPV typing, new screening guidelines reviewed.  [place], 11:33 AM 08/27/2018

## 2018-08-27 NOTE — Patient Instructions (Signed)
[facility]  Dr Carlean Purl  [phone]  [phone]  Breast center   Health Maintenance, Female Adopting a healthy lifestyle and getting preventive care can go a long way to promote health and wellness. Talk with your health care provider about what schedule of regular examinations is right for you. This is a good chance for you to check in with your provider about disease prevention and staying healthy. In between checkups, there are plenty of things you can do on your own. Experts have done a lot of research about which lifestyle changes and preventive measures are most likely to keep you healthy. Ask your health care provider for more information. Weight and diet Eat a healthy diet  Be sure to include plenty of vegetables, fruits, low-fat dairy products, and lean protein.  Do not eat a lot of foods high in solid fats, added sugars, or salt.  Get regular exercise. This is one of the most important things you can do for your health. ? Most adults should exercise for at least 150 minutes each week. The exercise should increase your heart rate and make you sweat (moderate-intensity exercise). ? Most adults should also do strengthening exercises at least twice a week. This is in addition to the moderate-intensity exercise. Maintain a healthy weight  Body mass index (BMI) is a measurement that can be used to identify possible weight problems. It estimates body fat based on height and weight. Your health care provider can help determine your BMI and help you achieve or maintain a healthy weight.  For females 75 years of age and older: ? A BMI below 18.5 is considered underweight. ? A BMI of 18.5 to 24.9 is normal. ? A BMI of 25 to 29.9 is considered overweight. ? A BMI of 30 and above is considered obese. Watch levels of cholesterol and blood lipids  You should start having your blood tested for lipids and cholesterol at 51 years of age, then have this test every 5 years.  You may need to have your  cholesterol levels checked more often if: ? Your lipid or cholesterol levels are high. ? You are older than 51 years of age. ? You are at high risk for heart disease. Cancer screening Lung Cancer  Lung cancer screening is recommended for adults 58-69 years old who are at high risk for lung cancer because of a history of smoking.  A yearly low-dose CT scan of the lungs is recommended for people who: ? Currently smoke. ? Have quit within the past 15 years. ? Have at least a 30-pack-year history of smoking. A pack year is smoking an average of one pack of cigarettes a day for 1 year.  Yearly screening should continue until it has been 15 years since you quit.  Yearly screening should stop if you develop a health problem that would prevent you from having lung cancer treatment. Breast Cancer  Practice breast self-awareness. This means understanding how your breasts normally appear and feel.  It also means doing regular breast self-exams. Let your health care provider know about any changes, no matter how small.  If you are in your 20s or 30s, you should have a clinical breast exam (CBE) by a health care provider every 1-3 years as part of a regular health exam.  If you are 57 or older, have a CBE every year. Also consider having a breast X-ray (mammogram) every year.  If you have a family history of breast cancer, talk to your health care provider  about genetic screening.  If you are at high risk for breast cancer, talk to your health care provider about having an MRI and a mammogram every year.  Breast cancer gene (BRCA) assessment is recommended for women who have family members with BRCA-related cancers. BRCA-related cancers include: ? Breast. ? Ovarian. ? Tubal. ? Peritoneal cancers.  Results of the assessment will determine the need for genetic counseling and BRCA1 and BRCA2 testing. Cervical Cancer Your health care provider may recommend that you be screened regularly for  cancer of the pelvic organs (ovaries, uterus, and vagina). This screening involves a pelvic examination, including checking for microscopic changes to the surface of your cervix (Pap test). You may be encouraged to have this screening done every 3 years, beginning at age 31.  For women ages 2-65, health care providers may recommend pelvic exams and Pap testing every 3 years, or they may recommend the Pap and pelvic exam, combined with testing for human papilloma virus (HPV), every 5 years. Some types of HPV increase your risk of cervical cancer. Testing for HPV may also be done on women of any age with unclear Pap test results.  Other health care providers may not recommend any screening for nonpregnant women who are considered low risk for pelvic cancer and who do not have symptoms. Ask your health care provider if a screening pelvic exam is right for you.  If you have had past treatment for cervical cancer or a condition that could lead to cancer, you need Pap tests and screening for cancer for at least 20 years after your treatment. If Pap tests have been discontinued, your risk factors (such as having a new sexual partner) need to be reassessed to determine if screening should resume. Some women have medical problems that increase the chance of getting cervical cancer. In these cases, your health care provider may recommend more frequent screening and Pap tests. Colorectal Cancer  This type of cancer can be detected and often prevented.  Routine colorectal cancer screening usually begins at 51 years of age and continues through 51 years of age.  Your health care provider may recommend screening at an earlier age if you have risk factors for colon cancer.  Your health care provider may also recommend using home test kits to check for hidden blood in the stool.  A small camera at the end of a tube can be used to examine your colon directly (sigmoidoscopy or colonoscopy). This is done to check for  the earliest forms of colorectal cancer.  Routine screening usually begins at age 88.  Direct examination of the colon should be repeated every 5-10 years through 51 years of age. However, you may need to be screened more often if early forms of precancerous polyps or small growths are found. Skin Cancer  Check your skin from head to toe regularly.  Tell your health care provider about any new moles or changes in moles, especially if there is a change in a mole's shape or color.  Also tell your health care provider if you have a mole that is larger than the size of a pencil eraser.  Always use sunscreen. Apply sunscreen liberally and repeatedly throughout the day.  Protect yourself by wearing long sleeves, pants, a wide-brimmed hat, and sunglasses whenever you are outside. Heart disease, diabetes, and high blood pressure  High blood pressure causes heart disease and increases the risk of stroke. High blood pressure is more likely to develop in: ? People who have blood  pressure in the high end of the normal range (130-139/85-89 mm Hg). ? People who are overweight or obese. ? People who are African American.  If you are 73-8 years of age, have your blood pressure checked every 3-5 years. If you are 47 years of age or older, have your blood pressure checked every year. You should have your blood pressure measured twice-once when you are at a hospital or clinic, and once when you are not at a hospital or clinic. Record the average of the two measurements. To check your blood pressure when you are not at a hospital or clinic, you can use: ? An automated blood pressure machine at a pharmacy. ? A home blood pressure monitor.  If you are between 27 years and 28 years old, ask your health care provider if you should take aspirin to prevent strokes.  Have regular diabetes screenings. This involves taking a blood sample to check your fasting blood sugar level. ? If you are at a normal weight and  have a low risk for diabetes, have this test once every three years after 51 years of age. ? If you are overweight and have a high risk for diabetes, consider being tested at a younger age or more often. Preventing infection Hepatitis B  If you have a higher risk for hepatitis B, you should be screened for this virus. You are considered at high risk for hepatitis B if: ? You were born in a country where hepatitis B is common. Ask your health care provider which countries are considered high risk. ? Your parents were born in a high-risk country, and you have not been immunized against hepatitis B (hepatitis B vaccine). ? You have HIV or AIDS. ? You use needles to inject street drugs. ? You live with someone who has hepatitis B. ? You have had sex with someone who has hepatitis B. ? You get hemodialysis treatment. ? You take certain medicines for conditions, including cancer, organ transplantation, and autoimmune conditions. Hepatitis C  Blood testing is recommended for: ? Everyone born from [birth year] through 1963. ? Anyone with known risk factors for hepatitis C. Sexually transmitted infections (STIs)  You should be screened for sexually transmitted infections (STIs) including gonorrhea and chlamydia if: ? You are sexually active and are younger than 51 years of age. ? You are older than 51 years of age and your health care provider tells you that you are at risk for this type of infection. ? Your sexual activity has changed since you were last screened and you are at an increased risk for chlamydia or gonorrhea. Ask your health care provider if you are at risk.  If you do not have HIV, but are at risk, it may be recommended that you take a prescription medicine daily to prevent HIV infection. This is called pre-exposure prophylaxis (PrEP). You are considered at risk if: ? You are sexually active and do not regularly use condoms or know the HIV status of your partner(s). ? You take drugs by  injection. ? You are sexually active with a partner who has HIV. Talk with your health care provider about whether you are at high risk of being infected with HIV. If you choose to begin PrEP, you should first be tested for HIV. You should then be tested every 3 months for as long as you are taking PrEP. Pregnancy  If you are premenopausal and you may become pregnant, ask your health care provider about preconception counseling.  If  you may become pregnant, take 400 to 800 micrograms (mcg) of folic acid every day.  If you want to prevent pregnancy, talk to your health care provider about birth control (contraception). Osteoporosis and menopause  Osteoporosis is a disease in which the bones lose minerals and strength with aging. This can result in serious bone fractures. Your risk for osteoporosis can be identified using a bone density scan.  If you are 36 years of age or older, or if you are at risk for osteoporosis and fractures, ask your health care provider if you should be screened.  Ask your health care provider whether you should take a calcium or vitamin D supplement to lower your risk for osteoporosis.  Menopause may have certain physical symptoms and risks.  Hormone replacement therapy may reduce some of these symptoms and risks. Talk to your health care provider about whether hormone replacement therapy is right for you. Follow these instructions at home:  Schedule regular health, dental, and eye exams.  Stay current with your immunizations.  Do not use any tobacco products including cigarettes, chewing tobacco, or electronic cigarettes.  If you are pregnant, do not drink alcohol.  If you are breastfeeding, limit how much and how often you drink alcohol.  Limit alcohol intake to no more than 1 drink per day for nonpregnant women. One drink equals 12 ounces of beer, 5 ounces of wine, or 1 ounces of hard liquor.  Do not use street drugs.  Do not share needles.  Ask  your health care provider for help if you need support or information about quitting drugs.  Tell your health care provider if you often feel depressed.  Tell your health care provider if you have ever been abused or do not feel safe at home. This information is not intended to replace advice given to you by your health care provider. Make sure you discuss any questions you have with your health care provider. Document Released: 01/30/2011 Document Revised: 12/23/2015 Document Reviewed: 04/20/2015 Elsevier Interactive Patient Education  2019 [employer].

## 2018-08-28 LAB — COMPREHENSIVE METABOLIC PANEL
AG Ratio: 1.7 (calc) (ref 1.0–2.5)
ALT: 27 U/L (ref 6–29)
AST: 29 U/L (ref 10–35)
Albumin: 4.3 g/dL (ref 3.6–5.1)
Alkaline phosphatase (APISO): 94 U/L (ref 33–130)
BUN: 21 mg/dL (ref 7–25)
CALCIUM: 9.4 mg/dL (ref 8.6–10.4)
CO2: 23 mmol/L (ref 20–32)
Chloride: 105 mmol/L (ref 98–110)
Creat: 0.88 mg/dL (ref 0.50–1.05)
GLUCOSE: 86 mg/dL (ref 65–99)
Globulin: 2.6 g/dL (calc) (ref 1.9–3.7)
Potassium: 4.4 mmol/L (ref 3.5–5.3)
Sodium: 139 mmol/L (ref 135–146)
Total Bilirubin: 0.9 mg/dL (ref 0.2–1.2)
Total Protein: 6.9 g/dL (ref 6.1–8.1)

## 2018-08-28 LAB — LIPID PANEL
CHOL/HDL RATIO: 3.2 (calc) (ref <5.0)
Cholesterol: 187 mg/dL (ref <200)
HDL: 59 mg/dL (ref >50)
LDL Cholesterol (Calc): 104 mg/dL (calc) — ABNORMAL HIGH
Non-HDL Cholesterol (Calc): 128 mg/dL (calc) (ref <130)
TRIGLYCERIDES: 144 mg/dL (ref <150)

## 2018-08-28 LAB — CBC WITH DIFFERENTIAL/PLATELET
ABSOLUTE MONOCYTES: 390 {cells}/uL (ref 200–950)
BASOS ABS: 43 {cells}/uL (ref 0–200)
Basophils Relative: 0.7 %
EOS ABS: 183 {cells}/uL (ref 15–500)
Eosinophils Relative: 3 %
HEMATOCRIT: 41.3 % (ref 35.0–45.0)
Hemoglobin: 14 g/dL (ref 11.7–15.5)
Lymphs Abs: 1501 cells/uL (ref 850–3900)
MCH: 30.9 pg (ref 27.0–33.0)
MCHC: 33.9 g/dL (ref 32.0–36.0)
MCV: 91.2 fL (ref 80.0–100.0)
MPV: 11.1 fL (ref 7.5–12.5)
Monocytes Relative: 6.4 %
NEUTROS PCT: 65.3 %
Neutro Abs: 3983 cells/uL (ref 1500–7800)
Platelets: 344 10*3/uL (ref 140–400)
RBC: 4.53 10*6/uL (ref 3.80–5.10)
RDW: 12.8 % (ref 11.0–15.0)
Total Lymphocyte: 24.6 %
WBC: 6.1 10*3/uL (ref 3.8–10.8)

## 2018-08-28 LAB — TSH: TSH: 1.38 mIU/L

## 2018-08-29 LAB — PAP, TP IMAGING W/ HPV RNA, RFLX HPV TYPE 16,18/45: HPV DNA HIGH RISK: NOT DETECTED

## 2018-09-24 ENCOUNTER — Ambulatory Visit
Admission: RE | Admit: 2018-09-24 | Discharge: 2018-09-24 | Disposition: A | Discharge status: Home / Self Care | Payer: 59 | Source: Ambulatory Visit | Attending: Women's Health | Admitting: Women's Health

## 2018-09-24 DIAGNOSIS — Z1231 Encounter for screening mammogram for malignant neoplasm of breast: Secondary | ICD-10-CM

## 2018-10-18 MED FILL — LEVOTHYROXINE 75 MCG TABLET: 75 | 90 days supply | Qty: 90 | Fill #0

## 2018-12-29 ENCOUNTER — Encounter: Payer: Self-pay | Admitting: Women's Health

## 2018-12-29 ENCOUNTER — Telehealth: Payer: Self-pay | Admitting: Obstetrics and Gynecology

## 2018-12-29 ENCOUNTER — Other Ambulatory Visit: Payer: Self-pay | Admitting: Obstetrics and Gynecology

## 2018-12-29 MED ORDER — MEDROXYPROGESTERONE ACETATE 10 MG PO TABS: 10.0000 mg | ORAL_TABLET | Freq: Every day | ORAL | 0 refills | Status: DC

## 2018-12-29 NOTE — Telephone Encounter (Signed)
Phone call returned to patient after hours on weekend - Stamps she has heavy menses usually controlled with ibuprofen.   She had a menses 3 weeks ago.  That LMP was 5/10 - 5/17.  Six days later, started bleeding again. Using 2 night pads and changing every 1.5 - 2 hours.  Also using a tampon in addition to the two pads.   No pain.   This am had dizziness and lightheaded but not now.   She had a tubal ligation.  She was on OCPs in the past, but she did not feel well due to headaches and not feeling well on it.  She stopped these in December.   Provera 10 mg x 10 days after having a negative UPT. She will call the office in the am for follow up and go to the ER if bleeding increases

## 2018-12-29 NOTE — Telephone Encounter (Signed)
Phone call returned to patient [phone] after hours during weekend.   Answering service indicates patient is having heavy menses.   Multiple calls returned to the patient.  Message that caller is unavailable, circuits busy, caller is busy.  The phone is then disconnected automatically by the phone carrier.  Unable to leave message.

## 2018-12-30 ENCOUNTER — Other Ambulatory Visit: Payer: Self-pay | Admitting: Women's Health

## 2018-12-30 DIAGNOSIS — N938 Other specified abnormal uterine and vaginal bleeding: Secondary | ICD-10-CM

## 2018-12-30 NOTE — Progress Notes (Signed)
Called on-call  Dr. 12/29/2018 with complaint of heavy bleeding was prescribed Provera 10 for 10 days.  Telephone call to patient states bleeding has decreased, reviewed options will schedule sonohysterogram with Dr. Phineas Real.  History of monthly cycles with menorrhagia/BTL.  Last cycle 2 weeks ago.

## 2018-12-30 NOTE — Telephone Encounter (Signed)
Telephone call, history of monthly cycles with menorrhagia, last cycle less than 20 days, options reviewed, continue Provera, schedule sonohysterogram with Dr. Phineas Real, switched to appointments.

## 2019-01-08 ENCOUNTER — Other Ambulatory Visit: Payer: Self-pay

## 2019-01-09 ENCOUNTER — Ambulatory Visit: Payer: 59

## 2019-01-09 ENCOUNTER — Encounter: Payer: Self-pay | Admitting: Gynecology

## 2019-01-09 ENCOUNTER — Other Ambulatory Visit: Payer: Self-pay | Admitting: Gynecology

## 2019-01-09 ENCOUNTER — Ambulatory Visit (INDEPENDENT_AMBULATORY_CARE_PROVIDER_SITE_OTHER): Payer: 59 | Admitting: Gynecology

## 2019-01-09 VITALS — BP 120/70

## 2019-01-09 DIAGNOSIS — N921 Excessive and frequent menstruation with irregular cycle: Secondary | ICD-10-CM | POA: Diagnosis not present

## 2019-01-09 DIAGNOSIS — N840 Polyp of corpus uteri: Secondary | ICD-10-CM

## 2019-01-09 DIAGNOSIS — D252 Subserosal leiomyoma of uterus: Secondary | ICD-10-CM

## 2019-01-09 DIAGNOSIS — N938 Other specified abnormal uterine and vaginal bleeding: Secondary | ICD-10-CM

## 2019-01-09 DIAGNOSIS — D250 Submucous leiomyoma of uterus: Secondary | ICD-10-CM

## 2019-01-09 DIAGNOSIS — D251 Intramural leiomyoma of uterus: Secondary | ICD-10-CM

## 2019-01-09 NOTE — Addendum Note (Signed)
Addended by: Nelva Nay on: 01/09/2019 12:19 PM   Modules accepted: Orders

## 2019-01-09 NOTE — Patient Instructions (Signed)
Office will call you with the biopsy results and to arrange for the hysteroscopy D&C surgery.

## 2019-01-09 NOTE — Progress Notes (Signed)
    Kristen Mccarty [birth year] [id number]        52 y.o.  G3P0003 presents for sonohysterogram.  Normally sees Kristen Mccarty.  Notes menses have gotten heavier over the last year or 2.  Last month she had 2 bleeding episodes 1 of which was very heavy like she was miscarrying.  Was treated with progesterone which resolved the bleeding.  History of BTL.  No significant menopausal symptoms.  Past medical history,surgical history, problem list, medications, allergies, family history and social history were all reviewed and documented in the EPIC chart.  Directed ROS with pertinent positives and negatives documented in the history of present illness/assessment and plan.  Exam: Kristen Mccarty assistant Vitals:   01/09/19 1141  BP: 120/70   General appearance:  Normal Abdomen soft nontender without masses guarding rebound Pelvic external BUS vagina normal.  Cervix normal.  Uterus enlarged approximately 12 weeks size irregular.  Adnexa without gross masses or tenderness  Ultrasound shows uterus enlarged with multiple myomas the largest measuring 56 x 42 mm.  Approximately 7 other myomas measured ranging from 15 to 20 mm.  Some distortion of the endometrial echo which measured 2.9 mm.  Right and left ovaries visualized and normal.  Cul-de-sac negative.  Sonohysterogram performed, sterile technique, easy catheter introduction, good distention with anterior submucous myoma with approximately 30% of myoma protruding into the cavity.  Small 11 mm defect beside the myoma possibly representing small polyp.  Endometrial biopsy taken.  Patient tolerated well.  Assessment/Plan:  53 y.o. G3P0003 with heavy menses and multiple myomas.  1 myoma with submucous component and questionable small polyp next to it.  Discussed with patient to include pictures the findings.  We discussed her situation at age 51 in a perimenopausal timeframe.  Options for management include expectant management if biopsy negative, trial of hormonal  manipulation, ablation, IUD, hysteroscopy D&C with resection of questionable polyp if encountered and that portion of the submucous myoma that protrudes into the cavity understanding that we do not address the other myomas within the wall of the uterus and lastly hysterectomy.  We discussed various routes of hysterectomy to include attempted vaginal, LAVH, robotic assisted and TAH.  Given the bulk of the uterus recommend robotic assisted if we do proceed with hysterectomy ultimately.  At this point the patient would like more of a conservative approach and wants to proceed with the hysteroscopy D&C and then see how she does after the procedure.  I reviewed in general was involved with the procedure and we will proceed with scheduling this and then she will follow-up for a preoperative appointment before hand.    Kristen Auerbach MD, 12:08 PM 01/09/2019

## 2019-01-10 ENCOUNTER — Encounter: Payer: Self-pay | Admitting: *Deleted

## 2019-01-10 ENCOUNTER — Telehealth: Payer: Self-pay

## 2019-01-10 LAB — CBC WITH DIFFERENTIAL/PLATELET
Absolute Monocytes: 442 cells/uL (ref 200–950)
Basophils Absolute: 32 cells/uL (ref 0–200)
Basophils Relative: 0.5 %
Eosinophils Absolute: 154 cells/uL (ref 15–500)
Eosinophils Relative: 2.4 %
HCT: 38.2 % (ref 35.0–45.0)
Hemoglobin: 12.8 g/dL (ref 11.7–15.5)
Lymphs Abs: 1562 cells/uL (ref 850–3900)
MCH: 30.7 pg (ref 27.0–33.0)
MCHC: 33.5 g/dL (ref 32.0–36.0)
MCV: 91.6 fL (ref 80.0–100.0)
MPV: 10.6 fL (ref 7.5–12.5)
Monocytes Relative: 6.9 %
Neutro Abs: 4211 cells/uL (ref 1500–7800)
Neutrophils Relative %: 65.8 %
Platelets: 318 10*3/uL (ref 140–400)
RBC: 4.17 10*6/uL (ref 3.80–5.10)
RDW: 13.3 % (ref 11.0–15.0)
Total Lymphocyte: 24.4 %
WBC: 6.4 10*3/uL (ref 3.8–10.8)

## 2019-01-10 LAB — FOLLICLE STIMULATING HORMONE: FSH: 9.6 m[IU]/mL

## 2019-01-10 LAB — TSH: TSH: 1.14 mIU/L

## 2019-01-10 NOTE — Telephone Encounter (Signed)
I called and per DPR access left message that I was calling to discuss surgery scheduling.  I left her some available dates to consider and also mentioned Covid testing required 4 days before and then quarantine so consider that as well when picking a date. Asked her to call me.

## 2019-01-13 MED ORDER — MISOPROSTOL 100 MCG PO TABS: ORAL_TABLET | ORAL | 0 refills | Status: DC

## 2019-01-13 MED FILL — miSOPROStol 200 MCG TABS: 200 | 1 days supply | Qty: 1 | Fill #0

## 2019-01-13 NOTE — Telephone Encounter (Signed)
Spoke with patient and reviewed her insurance benefits and her estimated surgery prepymt due.  We discussed dates, Covid testing, need for quarantine after testing and pre op appt.  All of that was scheduled for her. Surgery date will be Weds., July 15 at 7:30am.  She will go on Sat 7/11/ at [place] for COVID testing at [facility].  [facility] pamphlet, Covid Sheet and Financial letter will be mailed to patient.  I reviewed with her the need for a Cytotec tab vaginally hs before surgery and warned her she might see spotting/bleeding and/or cramping as it does sometimes when working. No worries. Rx sent.

## 2019-01-14 ENCOUNTER — Encounter: Payer: Self-pay | Admitting: Women's Health

## 2019-01-16 ENCOUNTER — Encounter: Payer: Self-pay | Admitting: Gynecology

## 2019-02-04 ENCOUNTER — Telehealth: Payer: Self-pay

## 2019-02-04 NOTE — Telephone Encounter (Signed)
I called patient and asked her if I could re-schedule her surgery to start at 8:30am same day. She was scheduled for 9:00am. She agreed that 8:30am will be fine.

## 2019-02-06 ENCOUNTER — Encounter: Payer: Self-pay | Admitting: Gynecology

## 2019-02-06 ENCOUNTER — Ambulatory Visit: Payer: 59 | Admitting: Gynecology

## 2019-02-06 VITALS — BP 118/76

## 2019-02-06 DIAGNOSIS — D251 Intramural leiomyoma of uterus: Secondary | ICD-10-CM

## 2019-02-06 DIAGNOSIS — N920 Excessive and frequent menstruation with regular cycle: Secondary | ICD-10-CM

## 2019-02-06 DIAGNOSIS — D252 Subserosal leiomyoma of uterus: Secondary | ICD-10-CM | POA: Diagnosis not present

## 2019-02-06 DIAGNOSIS — D250 Submucous leiomyoma of uterus: Secondary | ICD-10-CM | POA: Diagnosis not present

## 2019-02-06 DIAGNOSIS — N840 Polyp of corpus uteri: Secondary | ICD-10-CM | POA: Diagnosis not present

## 2019-02-06 NOTE — Progress Notes (Signed)
    Kristen Mccarty 30-Jul-1966 [id number]        53 y.o.  G3P0003 presents for her preoperative visit upcoming hysteroscopy D&C resection of submucous myoma and endometrial polyp.  She has a history of worsening menorrhagia over the last 1 to 2 years.  Exam consistent with leiomyoma.  Ultrasound showed multiple myomas the largest measuring 56 mm.  Endometrial echo 2.9 mm.  Right and left ovaries normal.  Sonohysterogram showed anterior submucous myoma approximately 30% protruding into the cavity.  A separate 11 mm defect beside the myoma consistent with small polyp.  Endometrial biopsy showed benign endometrium.  BTL contraception.  Past medical history,surgical history, problem list, medications, allergies, family history and social history were all reviewed and documented in the EPIC chart.  Directed ROS with pertinent positives and negatives documented in the history of present illness/assessment and plan.  Exam: [name] assistant Vitals:   02/06/19 1431  BP: 118/76   General appearance:  Normal HEENT normal Lungs clear Cardiac regular rate no rubs murmurs or gallops Abdomen soft nontender without masses guarding rebound Pelvic external BUS vagina normal.  Cervix normal.  Uterus approximately 12 weeks size irregular midline mobile nontender.  Adnexa without masses or tenderness   Assessment/Plan:  53 y.o. G3P0003 with multiple myomas to include submucous component and small polyp.  Options for management were discussed to include expectant, hormonal manipulation, endometrial ablation, IUD, hysteroscopy with resection of intracavitary defects, and hysterectomy.  The pros and cons of each choice reviewed and at this point the patient elects for hysteroscopy D&C with resection of intracavitary defects.  She understands we are not resecting all of her myomas but only those portions at protrude into the cavity with hope that this lightens her menses and allows her to transition through  menopause.  No guarantees that this will lighten her menses and that we will progress to a more definitive treatment in the future.  I reviewed the proposed surgery with the patient to include the expected intraoperative and postoperative courses as well as the recovery period. The use of the hysteroscope, resectoscope and the D&C portion were all discussed. The risks of surgery to include infection, prolonged antibiotics, hemorrhage necessitating transfusion and the risks of transfusion, including transfusion reaction, hepatitis, HIV, mad cow disease and other unknown entities were all discussed understood and accepted. The risk of damage to internal organs during the procedure, either immediately recognized or delay recognized, including vagina, cervix, uterus, possible perforation causing damage to bowel, bladder, ureters, vessels and nerves necessitating major exploratory reparative surgery and future reparative surgeries including bladder repair, ureteral damage repair, bowel resection, ostomy formation was also discussed understood and accepted. The patient's questions were answered to her satisfaction and she is ready to proceed with surgery.     Anastasio Auerbach MD, 3:01 PM 02/06/2019

## 2019-02-06 NOTE — Patient Instructions (Signed)
Followup for surgery as scheduled. 

## 2019-02-07 ENCOUNTER — Encounter (HOSPITAL_COMMUNITY)
Admission: RE | Admit: 2019-02-07 | Discharge: 2019-02-07 | Disposition: A | Discharge status: Home / Self Care | Payer: 59 | Source: Ambulatory Visit | Attending: Gynecology | Admitting: Gynecology

## 2019-02-07 ENCOUNTER — Other Ambulatory Visit: Payer: Self-pay

## 2019-02-07 DIAGNOSIS — Z01812 Encounter for preprocedural laboratory examination: Secondary | ICD-10-CM | POA: Diagnosis not present

## 2019-02-07 DIAGNOSIS — Z1159 Encounter for screening for other viral diseases: Secondary | ICD-10-CM | POA: Insufficient documentation

## 2019-02-07 LAB — CBC
HCT: 40.6 % (ref 36.0–46.0)
Hemoglobin: 13.2 g/dL (ref 12.0–15.0)
MCH: 31.2 pg (ref 26.0–34.0)
MCHC: 32.5 g/dL (ref 30.0–36.0)
MCV: 96 fL (ref 80.0–100.0)
Platelets: 325 10*3/uL (ref 150–400)
RBC: 4.23 MIL/uL (ref 3.87–5.11)
RDW: 13.3 % (ref 11.5–15.5)
WBC: 7.5 10*3/uL (ref 4.0–10.5)
nRBC: 0 % (ref 0.0–0.2)

## 2019-02-08 ENCOUNTER — Other Ambulatory Visit (HOSPITAL_COMMUNITY)
Admission: RE | Admit: 2019-02-08 | Discharge: 2019-02-08 | Disposition: A | Discharge status: Home / Self Care | Payer: 59 | Source: Ambulatory Visit | Attending: Gynecology | Admitting: Gynecology

## 2019-02-08 DIAGNOSIS — Z1159 Encounter for screening for other viral diseases: Secondary | ICD-10-CM | POA: Diagnosis not present

## 2019-02-08 DIAGNOSIS — Z01812 Encounter for preprocedural laboratory examination: Secondary | ICD-10-CM | POA: Diagnosis not present

## 2019-02-08 LAB — SARS CORONAVIRUS 2 (TAT 6-24 HRS): SARS Coronavirus 2: NEGATIVE

## 2019-02-11 ENCOUNTER — Encounter (HOSPITAL_BASED_OUTPATIENT_CLINIC_OR_DEPARTMENT_OTHER): Payer: Self-pay | Admitting: *Deleted

## 2019-02-11 ENCOUNTER — Other Ambulatory Visit: Payer: Self-pay

## 2019-02-11 MED FILL — LEVOTHYROXINE 75 MCG TABLET: 75 | 90 days supply | Qty: 90 | Fill #1

## 2019-02-11 NOTE — Progress Notes (Signed)

## 2019-02-11 NOTE — Progress Notes (Signed)
Spoke w/ pt via phone for pre-op interview.  Npo after mn.  Arrive at [employer].  Needs urine preg.  Current cbc result dated 02-07-2019 in chart and epic.  Pt had covid test done 02-08-2019

## 2019-02-11 NOTE — H&P (Signed)
Kristen Mccarty [birth year] [id number]   History and Physical  Chief complaint: Menorrhagia, leiomyoma, endometrial polyp  History of present illness: 53 y.o. G3P0003 with a history of worsening menorrhagia over the last 1 to 2 years.  Exam consistent with leiomyoma.  Ultrasound showed multiple myomas the largest measuring 56 mm.  Endometrial echo 2.9 mm.  Right and left ovaries normal.  Sonohysterogram showed anterior submucous myoma approximately 30% protruding into the cavity.  A separate 11 mm defect beside the myoma consistent with small polyp.  Endometrial biopsy showed benign endometrium.  BTL contraception.  Patient is admitted for hysteroscopy D&C with resection of the submucous myoma and endometrial polyp  Past Medical History:  Diagnosis Date  . Hypothyroidism     Past Surgical History:  Procedure Laterality Date  . TONSILLECTOMY    . TUBAL LIGATION      Family History  Problem Relation Age of Onset  . Osteoporosis Mother   . Diabetes Father   . Cancer Father 65       prostate  . Heart disease Father   . Heart attack Maternal Grandmother   . Leukemia Paternal Grandmother   . Alcohol abuse Neg Hx   . Early death Neg Hx   . Hyperlipidemia Neg Hx   . Hypertension Neg Hx   . Kidney disease Neg Hx   . Stroke Neg Hx     Social History:  reports that she has never smoked. She has never used smokeless tobacco. She reports current alcohol use. She reports that she does not use drugs.  Allergies: No Known Allergies  Medications: See Epic for the most current list of medications.  ROS:  Was performed and pertinent positives and negatives are included in the history of present illness.  Exam:   Kristen Mccarty assistant Vitals:   02/06/19 1431  BP: 118/76   General appearance:  Normal HEENT normal Lungs clear Cardiac regular rate no rubs murmurs or gallops Abdomen soft nontender without masses guarding rebound Pelvic external BUS vagina normal.  Cervix normal.  Uterus  approximately 12 weeks size irregular midline mobile nontender.  Adnexa without masses or tenderness    Assessment/Plan:  53 y.o. G3P0003 with multiple myomas to include submucous component and small polyp.  Options for management were discussed to include expectant, hormonal manipulation, endometrial ablation, IUD, hysteroscopy with resection of intracavitary defects, and hysterectomy.  The pros and cons of each choice reviewed and at this point the patient elects for hysteroscopy D&C with resection of intracavitary defects.  She understands we are not resecting all of her myomas but only those portions at protrude into the cavity with hope that this lightens her menses and allows her to transition through menopause.  No guarantees that this will lighten her menses and that we will progress to a more definitive treatment in the future.  I reviewed the proposed surgery with the patient to include the expected intraoperative and postoperative courses as well as the recovery period. The use of the hysteroscope, resectoscope and the D&C portion were all discussed. The risks of surgery to include infection, prolonged antibiotics, hemorrhage necessitating transfusion and the risks of transfusion, including transfusion reaction, hepatitis, HIV, mad cow disease and other unknown entities were all discussed understood and accepted. The risk of damage to internal organs during the procedure, either immediately recognized or delay recognized, including vagina, cervix, uterus, possible perforation causing damage to bowel, bladder, ureters, vessels and nerves necessitating major exploratory reparative surgery and future reparative surgeries including bladder repair,  ureteral damage repair, bowel resection, ostomy formation was also discussed understood and accepted. The patient's questions were answered to her satisfaction and she is ready to proceed with surgery.     Kristen Auerbach MD, 8:29 AM 02/11/2019

## 2019-02-12 ENCOUNTER — Ambulatory Visit (HOSPITAL_BASED_OUTPATIENT_CLINIC_OR_DEPARTMENT_OTHER)
Admission: RE | Admit: 2019-02-12 | Discharge: 2019-02-12 | Disposition: A | Discharge status: Home / Self Care | Payer: 59 | Attending: Gynecology | Admitting: Gynecology

## 2019-02-12 ENCOUNTER — Ambulatory Visit (HOSPITAL_BASED_OUTPATIENT_CLINIC_OR_DEPARTMENT_OTHER): Payer: 59 | Admitting: Certified Registered"

## 2019-02-12 ENCOUNTER — Other Ambulatory Visit: Payer: Self-pay

## 2019-02-12 ENCOUNTER — Encounter (HOSPITAL_BASED_OUTPATIENT_CLINIC_OR_DEPARTMENT_OTHER): Payer: Self-pay | Admitting: Certified Registered"

## 2019-02-12 ENCOUNTER — Ambulatory Visit (HOSPITAL_BASED_OUTPATIENT_CLINIC_OR_DEPARTMENT_OTHER): Payer: 59 | Admitting: Physician Assistant

## 2019-02-12 ENCOUNTER — Encounter (HOSPITAL_BASED_OUTPATIENT_CLINIC_OR_DEPARTMENT_OTHER)
Admission: RE | Disposition: A | Discharge status: Home / Self Care | Payer: Self-pay | Source: Home / Self Care | Attending: Gynecology

## 2019-02-12 DIAGNOSIS — E669 Obesity, unspecified: Secondary | ICD-10-CM | POA: Insufficient documentation

## 2019-02-12 DIAGNOSIS — Z8249 Family history of ischemic heart disease and other diseases of the circulatory system: Secondary | ICD-10-CM | POA: Insufficient documentation

## 2019-02-12 DIAGNOSIS — D250 Submucous leiomyoma of uterus: Secondary | ICD-10-CM | POA: Insufficient documentation

## 2019-02-12 DIAGNOSIS — E039 Hypothyroidism, unspecified: Secondary | ICD-10-CM | POA: Insufficient documentation

## 2019-02-12 DIAGNOSIS — Z833 Family history of diabetes mellitus: Secondary | ICD-10-CM | POA: Insufficient documentation

## 2019-02-12 DIAGNOSIS — N920 Excessive and frequent menstruation with regular cycle: Secondary | ICD-10-CM | POA: Insufficient documentation

## 2019-02-12 DIAGNOSIS — Z8042 Family history of malignant neoplasm of prostate: Secondary | ICD-10-CM | POA: Diagnosis not present

## 2019-02-12 DIAGNOSIS — D259 Leiomyoma of uterus, unspecified: Secondary | ICD-10-CM | POA: Diagnosis not present

## 2019-02-12 DIAGNOSIS — Z6830 Body mass index (BMI) 30.0-30.9, adult: Secondary | ICD-10-CM | POA: Insufficient documentation

## 2019-02-12 DIAGNOSIS — Z8262 Family history of osteoporosis: Secondary | ICD-10-CM | POA: Diagnosis not present

## 2019-02-12 DIAGNOSIS — Z806 Family history of leukemia: Secondary | ICD-10-CM | POA: Diagnosis not present

## 2019-02-12 DIAGNOSIS — N840 Polyp of corpus uteri: Secondary | ICD-10-CM | POA: Diagnosis not present

## 2019-02-12 HISTORY — DX: Irregular menstruation, unspecified: N92.6

## 2019-02-12 HISTORY — DX: Polyp of corpus uteri: N84.0

## 2019-02-12 HISTORY — PX: DILATATION & CURETTAGE/HYSTEROSCOPY WITH MYOSURE: SHX6511

## 2019-02-12 HISTORY — DX: Leiomyoma of uterus, unspecified: D25.9

## 2019-02-12 LAB — POCT PREGNANCY, URINE: Preg Test, Ur: NEGATIVE

## 2019-02-12 SURGERY — DILATATION & CURETTAGE/HYSTEROSCOPY WITH MYOSURE
Anesthesia: General

## 2019-02-12 MED ORDER — ONDANSETRON HCL 4 MG/2ML IJ SOLN
INTRAMUSCULAR | Status: DC | PRN
  Administered 2019-02-12 (×2): 4 mg via INTRAVENOUS

## 2019-02-12 MED ORDER — LIDOCAINE 2% (20 MG/ML) 5 ML SYRINGE
INTRAMUSCULAR | Status: AC
  Filled 2019-02-12: qty 5

## 2019-02-12 MED ORDER — ONDANSETRON HCL 4 MG/2ML IJ SOLN
INTRAMUSCULAR | Status: AC
  Filled 2019-02-12: qty 4

## 2019-02-12 MED ORDER — PROPOFOL 10 MG/ML IV BOLUS
INTRAVENOUS | Status: DC | PRN
  Administered 2019-02-12: 150 mg via INTRAVENOUS

## 2019-02-12 MED ORDER — MEPERIDINE HCL 25 MG/ML IJ SOLN
6.2500 mg | INTRAMUSCULAR | Status: DC | PRN
  Filled 2019-02-12: qty 1

## 2019-02-12 MED ORDER — LACTATED RINGERS IV SOLN
INTRAVENOUS | Status: DC
  Administered 2019-02-12: 09:00:00 via INTRAVENOUS
  Administered 2019-02-12: 50 mL/h via INTRAVENOUS
  Filled 2019-02-12: qty 1000

## 2019-02-12 MED ORDER — PROMETHAZINE HCL 25 MG/ML IJ SOLN
6.2500 mg | INTRAMUSCULAR | Status: DC | PRN
  Filled 2019-02-12: qty 1

## 2019-02-12 MED ORDER — MIDAZOLAM HCL 5 MG/5ML IJ SOLN
INTRAMUSCULAR | Status: DC | PRN
  Administered 2019-02-12: 2 mg via INTRAVENOUS

## 2019-02-12 MED ORDER — MIDAZOLAM HCL 2 MG/2ML IJ SOLN
INTRAMUSCULAR | Status: AC
  Filled 2019-02-12: qty 2

## 2019-02-12 MED ORDER — SODIUM CHLORIDE 0.9 % IV SOLN
2.0000 g | INTRAVENOUS | Status: AC
  Administered 2019-02-12: 2 g via INTRAVENOUS
  Filled 2019-02-12: qty 2

## 2019-02-12 MED ORDER — FENTANYL CITRATE (PF) 100 MCG/2ML IJ SOLN
INTRAMUSCULAR | Status: AC
  Filled 2019-02-12: qty 2

## 2019-02-12 MED ORDER — SODIUM CHLORIDE 0.9 % IV SOLN
INTRAVENOUS | Status: AC
  Filled 2019-02-12: qty 2

## 2019-02-12 MED ORDER — OXYCODONE HCL 5 MG/5ML PO SOLN
5.0000 mg | Freq: Once | ORAL | Status: DC | PRN
  Filled 2019-02-12: qty 5

## 2019-02-12 MED ORDER — LIDOCAINE 2% (20 MG/ML) 5 ML SYRINGE
INTRAMUSCULAR | Status: DC | PRN
  Administered 2019-02-12: 60 mg via INTRAVENOUS

## 2019-02-12 MED ORDER — PROPOFOL 10 MG/ML IV BOLUS
INTRAVENOUS | Status: AC
  Filled 2019-02-12: qty 20

## 2019-02-12 MED ORDER — FENTANYL CITRATE (PF) 100 MCG/2ML IJ SOLN
INTRAMUSCULAR | Status: DC | PRN
  Administered 2019-02-12: 50 ug via INTRAVENOUS
  Administered 2019-02-12 (×2): 25 ug via INTRAVENOUS

## 2019-02-12 MED ORDER — DEXAMETHASONE SODIUM PHOSPHATE 10 MG/ML IJ SOLN
INTRAMUSCULAR | Status: DC | PRN
  Administered 2019-02-12: 10 mg via INTRAVENOUS

## 2019-02-12 MED ORDER — OXYCODONE HCL 5 MG PO TABS
5.0000 mg | ORAL_TABLET | Freq: Once | ORAL | Status: DC | PRN
  Filled 2019-02-12: qty 1

## 2019-02-12 MED ORDER — SODIUM CHLORIDE 0.9 % IR SOLN
Status: DC | PRN
  Administered 2019-02-12: 3000 mL

## 2019-02-12 MED ORDER — HYDROMORPHONE HCL 1 MG/ML IJ SOLN
0.2500 mg | INTRAMUSCULAR | Status: DC | PRN
  Filled 2019-02-12: qty 0.5

## 2019-02-12 MED ORDER — DEXAMETHASONE SODIUM PHOSPHATE 10 MG/ML IJ SOLN
INTRAMUSCULAR | Status: AC
  Filled 2019-02-12: qty 1

## 2019-02-12 MED ORDER — KETOROLAC TROMETHAMINE 30 MG/ML IJ SOLN
INTRAMUSCULAR | Status: AC
  Filled 2019-02-12: qty 1

## 2019-02-12 MED ORDER — LIDOCAINE HCL 1 % IJ SOLN
INTRAMUSCULAR | Status: AC
  Filled 2019-02-12: qty 60

## 2019-02-12 MED ORDER — KETOROLAC TROMETHAMINE 30 MG/ML IJ SOLN
INTRAMUSCULAR | Status: DC | PRN
  Administered 2019-02-12: 30 mg via INTRAVENOUS

## 2019-02-12 MED ORDER — LIDOCAINE HCL 1 % IJ SOLN
INTRAMUSCULAR | Status: DC | PRN
  Administered 2019-02-12: 10 mL

## 2019-02-12 SURGICAL SUPPLY — 20 items
CANISTER SUCT 3000ML PPV (MISCELLANEOUS) ×3 IMPLANT
CATH ROBINSON RED A/P 16FR (CATHETERS) ×3 IMPLANT
COVER WAND RF STERILE (DRAPES) ×3 IMPLANT
DEVICE MYOSURE LITE (MISCELLANEOUS) IMPLANT
DEVICE MYOSURE REACH (MISCELLANEOUS) ×2 IMPLANT
DILATOR CANAL MILEX (MISCELLANEOUS) IMPLANT
GAUZE 4X4 16PLY RFD (DISPOSABLE) ×3 IMPLANT
GLOVE BIO SURGEON STRL SZ7.5 (GLOVE) ×6 IMPLANT
GOWN STRL REUS W/TWL XL LVL3 (GOWN DISPOSABLE) ×3 IMPLANT
IV NS IRRIG 3000ML ARTHROMATIC (IV SOLUTION) ×3 IMPLANT
KIT PROCEDURE FLUENT (KITS) ×3 IMPLANT
KIT TURNOVER CYSTO (KITS) ×3 IMPLANT
MYOSURE XL FIBROID (MISCELLANEOUS)
PACK VAGINAL MINOR WOMEN LF (CUSTOM PROCEDURE TRAY) ×3 IMPLANT
PAD OB MATERNITY 4.3X12.25 (PERSONAL CARE ITEMS) ×3 IMPLANT
SEAL CERVICAL OMNI LOK (ABLATOR) IMPLANT
SEAL ROD LENS SCOPE MYOSURE (ABLATOR) ×3 IMPLANT
SYSTEM TISS REMOVAL MYOSURE XL (MISCELLANEOUS) IMPLANT
TOWEL OR 17X26 10 PK STRL BLUE (TOWEL DISPOSABLE) ×6 IMPLANT
WATER STERILE IRR 500ML POUR (IV SOLUTION) IMPLANT

## 2019-02-12 NOTE — Anesthesia Postprocedure Evaluation (Signed)
Anesthesia Post Note  Patient: Kristen Mccarty  Procedure(s) Performed: DILATATION & CURETTAGE/HYSTEROSCOPY WITH MYOSURE (N/A )     Patient location during evaluation: PACU Anesthesia Type: General Level of consciousness: awake and alert Pain management: pain level controlled Vital Signs Assessment: post-procedure vital signs reviewed and stable Respiratory status: spontaneous breathing, nonlabored ventilation and respiratory function stable Cardiovascular status: blood pressure returned to baseline and stable Postop Assessment: no apparent nausea or vomiting Anesthetic complications: no    Last Vitals:  Vitals:   02/12/19 0930 02/12/19 0945  BP: (!) 113/49 (!) 111/54  Pulse: (!) 55 (!) 57  Resp: 16 15  Temp:    SpO2: 100% 98%    Last Pain:  Vitals:   02/12/19 0930  TempSrc:   PainSc: 0-No pain                 Lynda Rainwater

## 2019-02-12 NOTE — H&P (Signed)
The patient was examined.  I reviewed the proposed surgery and consent form with the patient.  The dictated history and physical is current and accurate and all questions were answered. The patient is ready to proceed with surgery and has a realistic understanding and expectation for the outcome.   Anastasio Auerbach MD, 8:22 AM 02/12/2019

## 2019-02-12 NOTE — Transfer of Care (Signed)
Immediate Anesthesia Transfer of Care Note  Patient: Kristen Mccarty  Procedure(s) Performed: DILATATION & CURETTAGE/HYSTEROSCOPY WITH MYOSURE (N/A )  Patient Location: PACU  Anesthesia Type:General  Level of Consciousness: drowsy  Airway & Oxygen Therapy: Patient Spontanous Breathing and Patient connected to nasal cannula oxygen  Post-op Assessment: Report given to RN and Post -op Vital signs reviewed and stable  Post vital signs: Reviewed and stable  Last Vitals:  Vitals Value Taken Time  BP 98/51 02/12/19 0900  Temp    Pulse 58 02/12/19 0903  Resp 11 02/12/19 0903  SpO2 100 % 02/12/19 0903  Vitals shown include unvalidated device data.  Last Pain:  Vitals:   02/12/19 0659  TempSrc:   PainSc: 1       Patients Stated Pain Goal: 6 (16/10/96 0454)  Complications: No apparent anesthesia complications

## 2019-02-12 NOTE — Anesthesia Procedure Notes (Signed)
Procedure Name: LMA Insertion Date/Time: 02/12/2019 8:26 AM Performed by: Gwyndolyn Saxon, CRNA Pre-anesthesia Checklist: Patient identified, Emergency Drugs available, Suction available and Patient being monitored Patient Re-evaluated:Patient Re-evaluated prior to induction Oxygen Delivery Method: Circle system utilized Preoxygenation: Pre-oxygenation with 100% oxygen Induction Type: IV induction Ventilation: Mask ventilation without difficulty LMA: LMA inserted LMA Size: 4.0 Number of attempts: 1 Placement Confirmation: positive ETCO2 and breath sounds checked- equal and bilateral Tube secured with: Tape Dental Injury: Teeth and Oropharynx as per pre-operative assessment

## 2019-02-12 NOTE — Discharge Instructions (Signed)
° °  Postoperative Instructions Hysteroscopy D & C  Dr. Phineas Real and the nursing staff have discussed postoperative instructions with you.  If you have any questions please ask them before you leave the hospital, or call Dr Elisabeth Most office at [phone].    We would like to emphasize the following instructions:   ? Call the office to make your follow-up appointment as recommended by Dr Phineas Real (usually 1-2 weeks).  ? You were given a prescription, or one was ordered for you at the pharmacy you designated.  Get that prescription filled and take the medication according to instructions.  ? You may eat a regular diet, but slowly until you start having bowel movements.  ? Drink plenty of water daily.  ? Nothing in the vagina (intercourse, douching, objects of any kind) for two weeks.  When reinitiating intercourse, if it is uncomfortable, stop and make an appointment with Dr Phineas Real to be evaluated.  ? No driving for one to two days until the effects of anesthesia has worn off.  No traveling out of town for several days.  ? You may shower, but no baths for one week.  Walking up and down stairs is ok.  No heavy lifting, prolonged standing, repeated bending or any working out until your post op check.  ? Rest frequently, listen to your body and do not push yourself and overdo it.  ? Call if:  o Your pain medication does not seem strong enough. o Worsening pain or abdominal bloating o Persistent nausea or vomiting o Difficulty with urination or bowel movements. o Temperature of 101 degrees or higher. o Heavy vaginal bleeding.  If your period is due, you may use tampons. o You have any questions or concerns   Post Anesthesia Home Care Instructions  Activity: Get plenty of rest for the remainder of the day. A responsible individual must stay with you for 24 hours following the procedure.  For the next 24 hours, DO NOT: -Drive a car -Paediatric nurse -Drink alcoholic  beverages -Take any medication unless instructed by your physician -Make any legal decisions or sign important papers.  Meals: Start with liquid foods such as gelatin or soup. Progress to regular foods as tolerated. Avoid greasy, spicy, heavy foods. If nausea and/or vomiting occur, drink only clear liquids until the nausea and/or vomiting subsides. Call your physician if vomiting continues.  Special Instructions/Symptoms: Your throat may feel dry or sore from the anesthesia or the breathing tube placed in your throat during surgery. If this causes discomfort, gargle with warm salt water. The discomfort should disappear within 24 hours.  If you had a scopolamine patch placed behind your ear for the management of post- operative nausea and/or vomiting:  1. The medication in the patch is effective for 72 hours, after which it should be removed.  Wrap patch in a tissue and discard in the trash. Wash hands thoroughly with soap and water. 2. You may remove the patch earlier than 72 hours if you experience unpleasant side effects which may include dry mouth, dizziness or visual disturbances. 3. Avoid touching the patch. Wash your hands with soap and water after contact with the patch.

## 2019-02-12 NOTE — Anesthesia Preprocedure Evaluation (Signed)
Anesthesia Evaluation  Patient identified by MRN, date of birth, ID band Patient awake    Reviewed: Allergy & Precautions, NPO status , Patient's Chart, lab work & pertinent test results  Airway Mallampati: II  TM Distance: >3 FB Neck ROM: Full    Dental no notable dental hx.    Pulmonary neg pulmonary ROS,    Pulmonary exam normal breath sounds clear to auscultation       Cardiovascular negative cardio ROS Normal cardiovascular exam Rhythm:Regular Rate:Normal     Neuro/Psych negative neurological ROS  negative psych ROS   GI/Hepatic negative GI ROS, Neg liver ROS,   Endo/Other  Hypothyroidism   Renal/GU negative Renal ROS  negative genitourinary   Musculoskeletal negative musculoskeletal ROS (+)   Abdominal (+) + obese,   Peds negative pediatric ROS (+)  Hematology negative hematology ROS (+)   Anesthesia Other Findings   Reproductive/Obstetrics negative OB ROS                             Anesthesia Physical Anesthesia Plan  ASA: II  Anesthesia Plan: General   Post-op Pain Management:    Induction: Intravenous  PONV Risk Score and Plan: 3 and Ondansetron, Dexamethasone, Midazolam and Treatment may vary due to age or medical condition  Airway Management Planned: LMA  Additional Equipment:   Intra-op Plan:   Post-operative Plan: Extubation in OR  Informed Consent: I have reviewed the patients History and Physical, chart, labs and discussed the procedure including the risks, benefits and alternatives for the proposed anesthesia with the patient or authorized representative who has indicated his/her understanding and acceptance.     Dental advisory given  Plan Discussed with: CRNA  Anesthesia Plan Comments:         Anesthesia Quick Evaluation

## 2019-02-12 NOTE — Op Note (Signed)
Kristen Mccarty 09-06-1965 [id number]   Post Operative Note   Date of surgery:  02/12/2019  Pre Op Dx: Menorrhagia, leiomyoma, endometrial polyp  Post Op Dx: Menorrhagia, leiomyoma, endometrial polyp  Procedure: Hysteroscopy, D&C, MyoSure resection submucous myoma and endometrial polyp  Surgeon:  Belinda Block Elman Dettman  Anesthesia:  General  EBL: 5 cc  Distended media discrepancy: 50 cc saline  Complications:  None  Specimen: #1 endometrial polyp and submucous myoma fragments #2 endometrial curetting to pathology  Findings: EUA: External BUS vagina normal.  Cervix normal.  Uterus bulky and irregular consistent with leiomyoma.  Adnexa without gross masses   Hysteroscopy: Adequate noting fundus, anterior/posterior endometrial surfaces, right/left tubal ostia, lower uterine segment and endocervical canal all visualized.  Classic submucous myoma anterior lower uterine segment resected in its entirety.  Small classic appearing endometrial polyp superior to the submucous myoma anteriorly resected to the level of the surrounding endometrium.  Procedure:  The patient was taken to the operating room, was placed in the low dorsal lithotomy position, underwent general anesthesia, received a perineal/vaginal preparation per nursing personnel and the bladder was emptied with an in and out Foley catheterization. The timeout was performed by the surgical team. An EUA was performed. The patient was draped in the usual fashion. The cervix was visualized with a speculum, anterior lip grasped with a single-tooth tenaculum and a paracervical block was placed using 10 cc's of 1% lidocaine. The cervix was gently dilated to admit the Myosure hysteroscope and hysteroscopy was performed with findings noted above. Using the Myosure Reach resectoscopic wand the submucous myoma and endometrial polyp were resected in their entirety to the level the surrounding endometrium. A gentle sharp curettage was performed. Both  specimens were sent separately to pathology.  Repeat hysteroscopy showed an empty cavity with good distention and no evidence of perforation. The instruments were removed and adequate hemostasis was visualized at the tenaculum site and external cervical os.  The specimens were identified for pathology.  The sponge, needle and instrument count were verified correct.  The patient was wanded per protocol.  The patient was given intraoperative Toradol, was awakened without difficulty and was taken to the recovery room in good condition having tolerated the procedure well.   Anastasio Auerbach MD, 9:09 AM 02/12/2019

## 2019-02-13 ENCOUNTER — Encounter (HOSPITAL_BASED_OUTPATIENT_CLINIC_OR_DEPARTMENT_OTHER): Payer: Self-pay | Admitting: Gynecology

## 2019-02-14 ENCOUNTER — Encounter: Payer: Self-pay | Admitting: Gynecology

## 2019-02-24 ENCOUNTER — Encounter: Payer: Self-pay | Admitting: Gynecology

## 2019-02-24 NOTE — Telephone Encounter (Signed)
Not unusual to have some irregular bleeding the month or so following a D&C.  It may be menses starting early or still some healing.  Tampons are okay

## 2019-04-03 ENCOUNTER — Telehealth: Payer: Self-pay

## 2019-04-03 ENCOUNTER — Other Ambulatory Visit: Payer: Self-pay

## 2019-04-03 ENCOUNTER — Encounter: Payer: Self-pay | Admitting: Gynecology

## 2019-04-03 ENCOUNTER — Ambulatory Visit: Payer: 59 | Admitting: Gynecology

## 2019-04-03 VITALS — BP 118/76

## 2019-04-03 DIAGNOSIS — R300 Dysuria: Secondary | ICD-10-CM | POA: Diagnosis not present

## 2019-04-03 DIAGNOSIS — N3000 Acute cystitis without hematuria: Secondary | ICD-10-CM | POA: Diagnosis not present

## 2019-04-03 MED ORDER — SULFAMETHOXAZOLE-TRIMETHOPRIM 800-160 MG PO TABS: 1.0000 | ORAL_TABLET | Freq: Two times a day (BID) | ORAL | 0 refills | Status: DC

## 2019-04-03 MED FILL — SULFAMETHOXAZOLE-TMP DS TAB: 800-160 | 3 days supply | Qty: 6 | Fill #0

## 2019-04-03 NOTE — Progress Notes (Signed)
    Kristen Mccarty 08/24/1965 [id number]        53 y.o.  G3P0003 presents with several days of urinary frequency and dysuria.  Slight urgency also.  Feels like when she had a UTI in the past.  No low back pain fever or chills.  No vaginal discharge or irritation.  Past medical history,surgical history, problem list, medications, allergies, family history and social history were all reviewed and documented in the EPIC chart.  Directed ROS with pertinent positives and negatives documented in the history of present illness/assessment and plan.  Exam: Vitals:   04/03/19 1423  BP: 118/76   General appearance:  Normal Spine straight without CVA tenderness Abdomen soft nontender without masses guarding rebound  Assessment/Plan:  53 y.o. G3P0003 with history and urine analysis consistent with early UTI.  Will treat with Septra DS 1 p.o. twice daily x3 days.  OTC Azo as needed.  Follow-up if symptoms persist, worsen or recur.    Anastasio Auerbach MD, 2:49 PM 04/03/2019

## 2019-04-03 NOTE — Patient Instructions (Signed)
Take the antibiotic twice daily for 3 days.

## 2019-04-03 NOTE — Telephone Encounter (Signed)
Patient called c/o UTI sx. She said urgency began yesterday and today frequency with scanty output.  She is leaving tomorrow to go out of town to wedding.  Office visit scheduled for this afternoon at 2:40pm.

## 2019-04-05 LAB — URINALYSIS, COMPLETE W/RFL CULTURE
Bilirubin Urine: NEGATIVE
Glucose, UA: NEGATIVE
Hyaline Cast: NONE SEEN /LPF
Ketones, ur: NEGATIVE
Leukocyte Esterase: NEGATIVE
Nitrites, Initial: NEGATIVE
Protein, ur: NEGATIVE
Specific Gravity, Urine: 1.029 (ref 1.001–1.03)
pH: 5 (ref 5.0–8.0)

## 2019-04-05 LAB — URINE CULTURE
MICRO NUMBER:: 848923
Result:: NO GROWTH
SPECIMEN QUALITY:: ADEQUATE

## 2019-04-05 LAB — CULTURE INDICATED

## 2019-04-23 ENCOUNTER — Encounter: Payer: Self-pay | Admitting: Gynecology

## 2019-06-11 DIAGNOSIS — Z23 Encounter for immunization: Secondary | ICD-10-CM | POA: Diagnosis not present

## 2019-06-16 MED FILL — LEVOTHYROXINE 75 MCG TABLET: 75 | 90 days supply | Qty: 90 | Fill #2

## 2019-06-23 ENCOUNTER — Other Ambulatory Visit: Payer: Self-pay

## 2019-06-23 DIAGNOSIS — Z20822 Contact with and (suspected) exposure to covid-19: Secondary | ICD-10-CM

## 2019-06-25 LAB — NOVEL CORONAVIRUS, NAA: SARS-CoV-2, NAA: NOT DETECTED

## 2019-06-30 ENCOUNTER — Other Ambulatory Visit: Payer: Self-pay

## 2019-06-30 ENCOUNTER — Encounter: Payer: Self-pay | Admitting: Women's Health

## 2019-06-30 DIAGNOSIS — Z20822 Contact with and (suspected) exposure to covid-19: Secondary | ICD-10-CM

## 2019-07-01 LAB — NOVEL CORONAVIRUS, NAA: SARS-CoV-2, NAA: NOT DETECTED

## 2019-08-14 DIAGNOSIS — H524 Presbyopia: Secondary | ICD-10-CM | POA: Diagnosis not present

## 2019-08-14 DIAGNOSIS — H52223 Regular astigmatism, bilateral: Secondary | ICD-10-CM | POA: Diagnosis not present

## 2019-08-29 ENCOUNTER — Other Ambulatory Visit: Payer: Self-pay

## 2019-09-01 ENCOUNTER — Other Ambulatory Visit: Payer: Self-pay

## 2019-09-01 ENCOUNTER — Encounter: Payer: Self-pay | Admitting: Women's Health

## 2019-09-01 ENCOUNTER — Ambulatory Visit (INDEPENDENT_AMBULATORY_CARE_PROVIDER_SITE_OTHER): Payer: 59 | Admitting: Women's Health

## 2019-09-01 VITALS — BP 124/80 | Ht 66.0 in | Wt 200.0 lb

## 2019-09-01 DIAGNOSIS — Z01419 Encounter for gynecological examination (general) (routine) without abnormal findings: Secondary | ICD-10-CM

## 2019-09-01 DIAGNOSIS — Z13220 Encounter for screening for lipoid disorders: Secondary | ICD-10-CM

## 2019-09-01 DIAGNOSIS — E038 Other specified hypothyroidism: Secondary | ICD-10-CM | POA: Diagnosis not present

## 2019-09-01 DIAGNOSIS — E039 Hypothyroidism, unspecified: Secondary | ICD-10-CM | POA: Insufficient documentation

## 2019-09-01 DIAGNOSIS — N921 Excessive and frequent menstruation with irregular cycle: Secondary | ICD-10-CM | POA: Diagnosis not present

## 2019-09-01 MED ORDER — LEVOTHYROXINE SODIUM 75 MCG PO TABS: 75.0000 ug | ORAL_TABLET | Freq: Every day | ORAL | 4 refills | Status: DC

## 2019-09-01 MED ORDER — TRANEXAMIC ACID 650 MG PO TABS: ORAL_TABLET | ORAL | 12 refills | Status: DC

## 2019-09-01 MED FILL — LEVOTHYROXINE 75 MCG TABLET: 75 | 90 days supply | Qty: 90 | Fill #0

## 2019-09-01 MED FILL — TRANEXAMIC ACID 650 MG TAB: 650 | 5 days supply | Qty: 30 | Fill #0

## 2019-09-01 NOTE — Patient Instructions (Signed)
[facility] Dr Carlean Purl [phone] Vit D 2000  Health Maintenance, Female Adopting a healthy lifestyle and getting preventive care are important in promoting health and wellness. Ask your health care provider about:  The right schedule for you to have regular tests and exams.  Things you can do on your own to prevent diseases and keep yourself healthy. What should I know about diet, weight, and exercise? Eat a healthy diet   Eat a diet that includes plenty of vegetables, fruits, low-fat dairy products, and lean protein.  Do not eat a lot of foods that are high in solid fats, added sugars, or sodium. Maintain a healthy weight Body mass index (BMI) is used to identify weight problems. It estimates body fat based on height and weight. Your health care provider can help determine your BMI and help you achieve or maintain a healthy weight. Get regular exercise Get regular exercise. This is one of the most important things you can do for your health. Most adults should:  Exercise for at least 150 minutes each week. The exercise should increase your heart rate and make you sweat (moderate-intensity exercise).  Do strengthening exercises at least twice a week. This is in addition to the moderate-intensity exercise.  Spend less time sitting. Even light physical activity can be beneficial. Watch cholesterol and blood lipids Have your blood tested for lipids and cholesterol at 52 years of age, then have this test every 5 years. Have your cholesterol levels checked more often if:  Your lipid or cholesterol levels are high.  You are older than 52 years of age.  You are at high risk for heart disease. What should I know about cancer screening? Depending on your health history and family history, you may need to have cancer screening at various ages. This may include screening for:  Breast cancer.  Cervical cancer.  Colorectal cancer.  Skin cancer.  Lung cancer. What should  I know about heart disease, diabetes, and high blood pressure? Blood pressure and heart disease  High blood pressure causes heart disease and increases the risk of stroke. This is more likely to develop in people who have high blood pressure readings, are of African descent, or are overweight.  Have your blood pressure checked: ? Every 3-5 years if you are 73-71 years of age. ? Every year if you are 39 years old or older. Diabetes Have regular diabetes screenings. This checks your fasting blood sugar level. Have the screening done:  Once every three years after age 81 if you are at a normal weight and have a low risk for diabetes.  More often and at a younger age if you are overweight or have a high risk for diabetes. What should I know about preventing infection? Hepatitis B If you have a higher risk for hepatitis B, you should be screened for this virus. Talk with your health care provider to find out if you are at risk for hepatitis B infection. Hepatitis C Testing is recommended for:  Everyone born from [birth year] through 1963.  Anyone with known risk factors for hepatitis C. Sexually transmitted infections (STIs)  Get screened for STIs, including gonorrhea and chlamydia, if: ? You are sexually active and are younger than 52 years of age. ? You are older than 52 years of age and your health care provider tells you that you are at risk for this type of infection. ? Your sexual activity has changed since you were last screened, and you are at increased  risk for chlamydia or gonorrhea. Ask your health care provider if you are at risk.  Ask your health care provider about whether you are at high risk for HIV. Your health care provider may recommend a prescription medicine to help prevent HIV infection. If you choose to take medicine to prevent HIV, you should first get tested for HIV. You should then be tested every 3 months for as long as you are taking the medicine. Pregnancy  If you are  about to stop having your period (premenopausal) and you may become pregnant, seek counseling before you get pregnant.  Take 400 to 800 micrograms (mcg) of folic acid every day if you become pregnant.  Ask for birth control (contraception) if you want to prevent pregnancy. Osteoporosis and menopause Osteoporosis is a disease in which the bones lose minerals and strength with aging. This can result in bone fractures. If you are 9 years old or older, or if you are at risk for osteoporosis and fractures, ask your health care provider if you should:  Be screened for bone loss.  Take a calcium or vitamin D supplement to lower your risk of fractures.  Be given hormone replacement therapy (HRT) to treat symptoms of menopause. Follow these instructions at home: Lifestyle  Do not use any products that contain nicotine or tobacco, such as cigarettes, e-cigarettes, and chewing tobacco. If you need help quitting, ask your health care provider.  Do not use street drugs.  Do not share needles.  Ask your health care provider for help if you need support or information about quitting drugs. Alcohol use  Do not drink alcohol if: ? Your health care provider tells you not to drink. ? You are pregnant, may be pregnant, or are planning to become pregnant.  If you drink alcohol: ? Limit how much you use to 0-1 drink a day. ? Limit intake if you are breastfeeding.  Be aware of how much alcohol is in your drink. In the [place] one drink equals one 12 oz bottle of beer (355 mL), one 5 oz glass of wine (148 mL), or one 1 oz glass of hard liquor (44 mL). General instructions  Schedule regular health, dental, and eye exams.  Stay current with your vaccines.  Tell your health care provider if: ? You often feel depressed. ? You have ever been abused or do not feel safe at home. Summary  Adopting a healthy lifestyle and getting preventive care are important in promoting health and wellness.  Follow  your health care provider's instructions about healthy diet, exercising, and getting tested or screened for diseases.  Follow your health care provider's instructions on monitoring your cholesterol and blood pressure. This information is not intended to replace advice given to you by your health care provider. Make sure you discuss any questions you have with your health care provider. Document Revised: 07/10/2018 Document Reviewed: 07/10/2018 Elsevier Patient Education  2020 [employer].

## 2019-09-01 NOTE — Progress Notes (Signed)
Kristen Mccarty [birth year] [id number]    History:    Presents for annual exam.  Regular monthly cycle with heavy flow, no spotting/BTL.  Reports changing protection hourly on heaviest days.  01/2019 benign endometrial polyp for unusual spotting which has now resolved.  Normal Pap and mammogram history.  Has not had a screening colonoscopy.  Hypothyroidism on 75 mcg daily.  Has had some anxiety and depression in the past currently taking no medication.  Past medical histo ry, past surgical history, family history and social history were all reviewed and documented in the EPIC chart.  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  Originally from [place] bilingual.  Mother osteoporosis, father diabetes and heart disease.  Oldest son graduated from [place] is now stationed in [place].  Daughter graduated from [employer] applying to med school, youngest son 77, in high school.  ROS:  A ROS was performed and pertinent positives and negatives are included.  Exam:  Vitals:   09/01/19 0939  BP: 124/80  Weight: 200 lb (90.7 kg)  Height: 5\' 6"  (1.676 m)   Body mass index is 32.28 kg/m.   General appearance:  Normal Thyroid:  Symmetrical, normal in size, without palpable masses or nodularity. Respiratory  Auscultation:  Clear without wheezing or rhonchi Cardiovascular  Auscultation:  Regular rate, without rubs, murmurs or gallops  Edema/varicosities:  Not grossly evident Abdominal  Soft,nontender, without masses, guarding or rebound.  Liver/spleen:  No organomegaly noted  Hernia:  None appreciated  Skin  Inspection:  Grossly normal   Breasts: Examined lying and sitting.     Right: Without masses, retractions, discharge or axillary adenopathy.     Left: Without masses, retractions, discharge or axillary adenopathy. Gentitourinary   Inguinal/mons:  Normal without inguinal adenopathy  External genitalia:  Normal  BUS/Urethra/Skene's glands:  Normal  Vagina:  Normal  Cervix:  Normal  Uterus:  normal in size,  shape and contour.  Midline and mobile  Adnexa/parametria:     Rt: Without masses or tenderness.   Lt: Without masses or tenderness.  Anus and perineum: Normal  Digital rectal exam: Normal sphincter tone without palpated masses or tenderness  Assessment/Plan:  54 y.o. MWF G3, P3 for annual exam with complaint of heavy flow with menstrual cycle.    Regular monthly cycle/menorrhagia /BTL Hypothyroid on Synthroid . Plan: Options for menorrhagia reviewed has been on OCs in the past had increased headaches and did not feel as well.  Option of Lysteda 1300 mg 3 times daily for the first 5 days of cycle prescription, proper use given and reviewed.  Instructed to call if no relief or intermenstrual bleeding occurs.  SBEs, continue annual 3D screening mammogram, calcium rich foods, vitamin D 2000 IUs daily.  Menopause reviewed no symptoms.  Aware of importance of weightbearing and regular exercise, yoga and decreasing calories/carbs encouraged.  Continue tennis.  Screening colonoscopy reviewed and encouraged, [facility] information given.  CBC, CMP, lipid panel, TSH,  T4, Pap normal 2020, new screening guidelines reviewed.   [place], 10:35 AM 09/01/2019

## 2019-09-02 ENCOUNTER — Other Ambulatory Visit: Payer: Self-pay | Admitting: Women's Health

## 2019-09-02 DIAGNOSIS — Z1231 Encounter for screening mammogram for malignant neoplasm of breast: Secondary | ICD-10-CM

## 2019-09-02 LAB — COMPREHENSIVE METABOLIC PANEL
AG Ratio: 1.8 (calc) (ref 1.0–2.5)
ALT: 22 U/L (ref 6–29)
AST: 19 U/L (ref 10–35)
Albumin: 4.5 g/dL (ref 3.6–5.1)
Alkaline phosphatase (APISO): 100 U/L (ref 37–153)
BUN: 18 mg/dL (ref 7–25)
CO2: 25 mmol/L (ref 20–32)
Calcium: 9.4 mg/dL (ref 8.6–10.4)
Chloride: 107 mmol/L (ref 98–110)
Creat: 0.79 mg/dL (ref 0.50–1.05)
Globulin: 2.5 g/dL (calc) (ref 1.9–3.7)
Glucose, Bld: 97 mg/dL (ref 65–99)
Potassium: 4.1 mmol/L (ref 3.5–5.3)
Sodium: 141 mmol/L (ref 135–146)
Total Bilirubin: 0.8 mg/dL (ref 0.2–1.2)
Total Protein: 7 g/dL (ref 6.1–8.1)

## 2019-09-02 LAB — CBC WITH DIFFERENTIAL/PLATELET
Absolute Monocytes: 453 cells/uL (ref 200–950)
Basophils Absolute: 29 cells/uL (ref 0–200)
Basophils Relative: 0.4 %
Eosinophils Absolute: 139 cells/uL (ref 15–500)
Eosinophils Relative: 1.9 %
HCT: 38.9 % (ref 35.0–45.0)
Hemoglobin: 12.8 g/dL (ref 11.7–15.5)
Lymphs Abs: 1533 cells/uL (ref 850–3900)
MCH: 28.4 pg (ref 27.0–33.0)
MCHC: 32.9 g/dL (ref 32.0–36.0)
MCV: 86.3 fL (ref 80.0–100.0)
MPV: 10.4 fL (ref 7.5–12.5)
Monocytes Relative: 6.2 %
Neutro Abs: 5147 cells/uL (ref 1500–7800)
Neutrophils Relative %: 70.5 %
Platelets: 338 10*3/uL (ref 140–400)
RBC: 4.51 10*6/uL (ref 3.80–5.10)
RDW: 12.9 % (ref 11.0–15.0)
Total Lymphocyte: 21 %
WBC: 7.3 10*3/uL (ref 3.8–10.8)

## 2019-09-02 LAB — TSH: TSH: 1.22 mIU/L

## 2019-09-02 LAB — LIPID PANEL
Cholesterol: 181 mg/dL (ref <200)
HDL: 52 mg/dL (ref >=50)
LDL Cholesterol (Calc): 106 mg/dL (calc) — ABNORMAL HIGH
Non-HDL Cholesterol (Calc): 129 mg/dL (calc) (ref <130)
Total CHOL/HDL Ratio: 3.5 (calc) (ref <5.0)
Triglycerides: 132 mg/dL (ref <150)

## 2019-09-02 LAB — T4: T4, Total: 10.3 ug/dL (ref 5.1–11.9)

## 2019-09-03 LAB — URINALYSIS, COMPLETE W/RFL CULTURE
Bilirubin Urine: NEGATIVE
Glucose, UA: NEGATIVE
Hgb urine dipstick: NEGATIVE
Hyaline Cast: NONE SEEN /LPF
Nitrites, Initial: NEGATIVE
Protein, ur: NEGATIVE
Specific Gravity, Urine: 1.029 (ref 1.001–1.03)
pH: <=5 (ref 5.0–8.0)

## 2019-09-03 LAB — URINE CULTURE
MICRO NUMBER:: 10104258
SPECIMEN QUALITY:: ADEQUATE

## 2019-09-03 LAB — CULTURE INDICATED

## 2019-09-09 ENCOUNTER — Ambulatory Visit (AMBULATORY_SURGERY_CENTER): Payer: Self-pay

## 2019-09-09 ENCOUNTER — Other Ambulatory Visit: Payer: Self-pay

## 2019-09-09 VITALS — Temp 97.7°F | Ht 66.0 in | Wt 199.0 lb

## 2019-09-09 DIAGNOSIS — Z01818 Encounter for other preprocedural examination: Secondary | ICD-10-CM

## 2019-09-09 DIAGNOSIS — Z1211 Encounter for screening for malignant neoplasm of colon: Secondary | ICD-10-CM

## 2019-09-09 MED ORDER — NA SULFATE-K SULFATE-MG SULF 17.5-3.13-1.6 GM/177ML PO SOLN: 1.0000 | Freq: Once | ORAL | 0 refills | Status: AC

## 2019-09-09 MED FILL — SUPREP BOWEL PREP KIT: 17.5-3.13-1 | 2 days supply | Qty: 354 | Fill #0

## 2019-09-09 NOTE — Progress Notes (Signed)
No egg or soy allergy known to patient  No issues with past sedation with any surgeries  or procedures, no intubation problems  No diet pills per patient No home 02 use per patient  No blood thinners per patient  Pt denies issues with constipation  No A fib or A flutter  EMMI video sent to pt's e mail  [facility] insurance   Due to the COVID-19 pandemic we are asking patients to follow these guidelines. Please only bring one care partner. Please be aware that your care partner may wait in the car in the parking lot or if they feel like they will be too hot to wait in the car, they may wait in the lobby on the 4th floor. All care partners are required to wear a mask the entire time (we do not have any that we can provide them), they need to practice social distancing, and we will do a Covid check for all patient's and care partners when you arrive. Also we will check their temperature and your temperature. If the care partner waits in their car they need to stay in the parking lot the entire time and we will call them on their cell phone when the patient is ready for discharge so they can bring the car to the front of the building. Also all patient's will need to wear a mask into building.

## 2019-09-18 ENCOUNTER — Other Ambulatory Visit: Payer: Self-pay | Admitting: Internal Medicine

## 2019-09-18 ENCOUNTER — Encounter: Payer: Self-pay | Admitting: Internal Medicine

## 2019-09-18 DIAGNOSIS — Z1159 Encounter for screening for other viral diseases: Secondary | ICD-10-CM | POA: Diagnosis not present

## 2019-09-19 LAB — SARS CORONAVIRUS 2 (TAT 6-24 HRS): SARS Coronavirus 2: NEGATIVE

## 2019-09-23 ENCOUNTER — Other Ambulatory Visit: Payer: Self-pay

## 2019-09-23 ENCOUNTER — Encounter: Payer: Self-pay | Admitting: Internal Medicine

## 2019-09-23 ENCOUNTER — Ambulatory Visit (AMBULATORY_SURGERY_CENTER): Payer: 59 | Admitting: Internal Medicine

## 2019-09-23 VITALS — BP 116/63 | HR 64 | Temp 96.9°F | Resp 13 | Ht 66.0 in | Wt 199.0 lb

## 2019-09-23 DIAGNOSIS — Z1211 Encounter for screening for malignant neoplasm of colon: Secondary | ICD-10-CM | POA: Diagnosis not present

## 2019-09-23 MED ORDER — SODIUM CHLORIDE 0.9 % IV SOLN: 500.0000 mL | Freq: Once | INTRAVENOUS | Status: DC

## 2019-09-23 NOTE — Progress Notes (Signed)
Temp by LC, Vitals by DT   Pt's states no medical or surgical changes since previsit or office visit.

## 2019-09-23 NOTE — Op Note (Signed)
[facility] Patient Name: Kristen Mccarty Procedure Date: 09/23/2019 1:31 PM MRN: [id number] Endoscopist: Gatha Mayer , MD Age: 54 Referring MD:  Date of Birth: [birth year] Gender: Female Account #: [id number] Procedure:                Colonoscopy Indications:              Screening for colorectal malignant neoplasm, This                            is the patient's first colonoscopy Medicines:                Propofol per Anesthesia, Monitored Anesthesia Care Procedure:                Pre-Anesthesia Assessment:                           - Prior to the procedure, a History and Physical                            was performed, and patient medications and                            allergies were reviewed. The patient's tolerance of                            previous anesthesia was also reviewed. The risks                            and benefits of the procedure and the sedation                            options and risks were discussed with the patient.                            All questions were answered, and informed consent                            was obtained. Prior Anticoagulants: The patient has                            taken no previous anticoagulant or antiplatelet                            agents. ASA Grade Assessment: II - A patient with                            mild systemic disease. After reviewing the risks                            and benefits, the patient was deemed in                            satisfactory condition to undergo the procedure.  After obtaining informed consent, the colonoscope                            was passed under direct vision. Throughout the                            procedure, the patient's blood pressure, pulse, and                            oxygen saturations were monitored continuously. The                            Colonoscope was introduced through the anus and   advanced to the the cecum, identified by                            appendiceal orifice and ileocecal valve. The                            ileocecal valve, appendiceal orifice, and rectum                            were photographed. The quality of the bowel                            preparation was excellent. The bowel preparation                            used was SUPREP via split dose instruction. Scope In: 1:41:52 PM Scope Out: 1:55:35 PM Scope Withdrawal Time: 0 hours 10 minutes 58 seconds  Total Procedure Duration: 0 hours 13 minutes 43 seconds  Findings:                 The perianal and digital rectal examinations were                            normal.                           Scattered small-mouthed diverticula were found in                            the left colon.                           The exam was otherwise without abnormality on                            direct and retroflexion views. Complications:            No immediate complications. Estimated blood loss:                            None. Estimated Blood Loss:     Estimated blood loss: none. Recommendation:           - Repeat colonoscopy or other appropriate test in  10 years for screening purposes.                           - Resume previous diet.                           - Continue present medications. Gatha Mayer, MD 09/23/2019 2:02:05 PM This report has been signed electronically.

## 2019-09-23 NOTE — Patient Instructions (Addendum)
No polyps or cancer seen.  You do have diverticulosis - thickened muscle rings and pouches in the colon wall. Please read the handout about this condition.  Next routine colonoscopy or other screening test in 10 years - 2031  I appreciate the opportunity to care for you. Gatha Mayer, MD, [facility]  Diverticulosis handout given to patient.  Resume previous diet. Continue present medications.  YOU HAD AN ENDOSCOPIC PROCEDURE TODAY AT [facility]:   Refer to the procedure report that was given to you for any specific questions about what was found during the examination.  If the procedure report does not answer your questions, please call your gastroenterologist to clarify.  If you requested that your care partner not be given the details of your procedure findings, then the procedure report has been included in a sealed envelope for you to review at your convenience later.  YOU SHOULD EXPECT: Some feelings of bloating in the abdomen. Passage of more gas than usual.  Walking can help get rid of the air that was put into your GI tract during the procedure and reduce the bloating. If you had a lower endoscopy (such as a colonoscopy or flexible sigmoidoscopy) you may notice spotting of blood in your stool or on the toilet paper. If you underwent a bowel prep for your procedure, you may not have a normal bowel movement for a few days.  Please Note:  You might notice some irritation and congestion in your nose or some drainage.  This is from the oxygen used during your procedure.  There is no need for concern and it should clear up in a day or so.  SYMPTOMS TO REPORT IMMEDIATELY:   Following lower endoscopy (colonoscopy or flexible sigmoidoscopy):  Excessive amounts of blood in the stool  Significant tenderness or worsening of abdominal pains  Swelling of the abdomen that is new, acute  Fever of 100F or higher For urgent or emergent issues, a gastroenterologist can be reached at  any hour by calling [phone].   DIET:  We do recommend a small meal at first, but then you may proceed to your regular diet.  Drink plenty of fluids but you should avoid alcoholic beverages for 24 hours.  ACTIVITY:  You should plan to take it easy for the rest of today and you should NOT DRIVE or use heavy machinery until tomorrow (because of the sedation medicines used during the test).    FOLLOW UP: Our staff will call the number listed on your records 48-72 hours following your procedure to check on you and address any questions or concerns that you may have regarding the information given to you following your procedure. If we do not reach you, we will leave a message.  We will attempt to reach you two times.  During this call, we will ask if you have developed any symptoms of COVID 19. If you develop any symptoms (ie: fever, flu-like symptoms, shortness of breath, cough etc.) before then, please call [phone].  If you test positive for Covid 19 in the 2 weeks post procedure, please call and report this information to [place].    If any biopsies were taken you will be contacted by phone or by letter within the next 1-3 weeks.  Please call us at [phone] if you have not heard about the biopsies in 3 weeks.    SIGNATURES/CONFIDENTIALITY: You and/or your care partner have signed paperwork which will be entered into your electronic medical record.  These signatures attest to the fact that that the information above on your After Visit Summary has been reviewed and is understood.  Full responsibility of the confidentiality of this discharge information lies with you and/or your care-partner.

## 2019-09-23 NOTE — Progress Notes (Signed)
Pt tolerated well. VSS. Awake and to recovery. 

## 2019-09-25 ENCOUNTER — Telehealth: Payer: Self-pay

## 2019-09-25 NOTE — Telephone Encounter (Signed)
  Follow up Call-  Call back number 09/23/2019  Post procedure Call Back phone  # [phone]  Permission to leave phone message Yes  Some recent data might be hidden     Patient questions:  Do you have a fever, pain , or abdominal swelling? No. Pain Score  0 *  Have you tolerated food without any problems? Yes.    Have you been able to return to your normal activities? Yes.    Do you have any questions about your discharge instructions: Diet   No. Medications  No. Follow up visit  No.  Do you have questions or concerns about your Care? No.  Actions: * If pain score is 4 or above: No action needed, pain <4. 1. Have you developed a fever since your procedure? no  2.   Have you had an respiratory symptoms (SOB or cough) since your procedure? no  3.   Have you tested positive for COVID 19 since your procedure no  4.   Have you had any family members/close contacts diagnosed with the COVID 19 since your procedure?  no   If yes to any of these questions please route to Joylene John, RN and Alphonsa Gin, Therapist, sports.

## 2019-09-29 LAB — HM COLONOSCOPY

## 2019-10-02 ENCOUNTER — Ambulatory Visit: Payer: 59

## 2019-10-02 ENCOUNTER — Other Ambulatory Visit: Payer: Self-pay

## 2019-10-02 ENCOUNTER — Ambulatory Visit
Admission: RE | Admit: 2019-10-02 | Discharge: 2019-10-02 | Disposition: A | Discharge status: Home / Self Care | Payer: 59 | Source: Ambulatory Visit | Attending: Women's Health | Admitting: Women's Health

## 2019-10-02 DIAGNOSIS — Z1231 Encounter for screening mammogram for malignant neoplasm of breast: Secondary | ICD-10-CM

## 2020-02-06 MED FILL — TRANEXAMIC ACID 650 MG TAB: 650 | 5 days supply | Qty: 30 | Fill #1

## 2020-03-18 MED FILL — LEVOTHYROXINE 75 MCG TABLET: 75 | 90 days supply | Qty: 90 | Fill #1

## 2020-05-19 ENCOUNTER — Telehealth: Payer: Self-pay | Admitting: Obstetrics and Gynecology

## 2020-05-19 MED ORDER — SULFAMETHOXAZOLE-TRIMETHOPRIM 800-160 MG PO TABS: 1.0000 | ORAL_TABLET | Freq: Two times a day (BID) | ORAL | 0 refills | Status: DC

## 2020-05-19 MED ORDER — PHENAZOPYRIDINE HCL 200 MG PO TABS: 200.0000 mg | ORAL_TABLET | Freq: Three times a day (TID) | ORAL | 0 refills | Status: DC | PRN

## 2020-05-19 NOTE — Telephone Encounter (Signed)
Patient paged, c/o a 2 hour history of urinary frequency and urgency, uncomfortable, no dysuria. Immediately has to go the bathroom, voiding small amounts. Requesting antibiotics.  Bactrim and pyridium sent to the pharmacy.

## 2020-06-08 ENCOUNTER — Encounter: Payer: Self-pay | Admitting: Obstetrics & Gynecology

## 2020-06-30 ENCOUNTER — Ambulatory Visit: Payer: Managed Care, Other (non HMO) | Admitting: Internal Medicine

## 2020-06-30 ENCOUNTER — Encounter: Payer: Self-pay | Admitting: Internal Medicine

## 2020-06-30 ENCOUNTER — Other Ambulatory Visit: Payer: Self-pay

## 2020-06-30 VITALS — BP 126/84 | HR 71 | Temp 97.9°F | Resp 16 | Ht 66.0 in | Wt 194.0 lb

## 2020-06-30 DIAGNOSIS — E039 Hypothyroidism, unspecified: Secondary | ICD-10-CM

## 2020-06-30 DIAGNOSIS — H60332 Swimmer's ear, left ear: Secondary | ICD-10-CM

## 2020-06-30 NOTE — Patient Instructions (Signed)
Hypothyroidism  Hypothyroidism is when the thyroid gland does not make enough of certain hormones (it is underactive). The thyroid gland is a small gland located in the lower front part of the neck, just in front of the windpipe (trachea). This gland makes hormones that help control how the body uses food for energy (metabolism) as well as how the heart and brain function. These hormones also play a role in keeping your bones strong. When the thyroid is underactive, it produces too little of the hormones thyroxine (T4) and triiodothyronine (T3). What are the causes? This condition may be caused by:  Hashimoto's disease. This is a disease in which the body's disease-fighting system (immune system) attacks the thyroid gland. This is the most common cause.  Viral infections.  Pregnancy.  Certain medicines.  Birth defects.  Past radiation treatments to the head or neck for cancer.  Past treatment with radioactive iodine.  Past exposure to radiation in the environment.  Past surgical removal of part or all of the thyroid.  Problems with a gland in the center of the brain (pituitary gland).  Lack of enough iodine in the diet. What increases the risk? You are more likely to develop this condition if:  You are female.  You have a family history of thyroid conditions.  You use a medicine called lithium.  You take medicines that affect the immune system (immunosuppressants). What are the signs or symptoms? Symptoms of this condition include:  Feeling as though you have no energy (lethargy).  Not being able to tolerate cold.  Weight gain that is not explained by a change in diet or exercise habits.  Lack of appetite.  Dry skin.  Coarse hair.  Menstrual irregularity.  Slowing of thought processes.  Constipation.  Sadness or depression. How is this diagnosed? This condition may be diagnosed based on:  Your symptoms, your medical history, and a physical exam.  Blood  tests. You may also have imaging tests, such as an ultrasound or MRI. How is this treated? This condition is treated with medicine that replaces the thyroid hormones that your body does not make. After you begin treatment, it may take several weeks for symptoms to go away. Follow these instructions at home:  Take over-the-counter and prescription medicines only as told by your health care provider.  If you start taking any new medicines, tell your health care provider.  Keep all follow-up visits as told by your health care provider. This is important. ? As your condition improves, your dosage of thyroid hormone medicine may change. ? You will need to have blood tests regularly so that your health care provider can monitor your condition. Contact a health care provider if:  Your symptoms do not get better with treatment.  You are taking thyroid replacement medicine and you: ? Sweat a lot. ? Have tremors. ? Feel anxious. ? Lose weight rapidly. ? Cannot tolerate heat. ? Have emotional swings. ? Have diarrhea. ? Feel weak. Get help right away if you have:  Chest pain.  An irregular heartbeat.  A rapid heartbeat.  Difficulty breathing. Summary  Hypothyroidism is when the thyroid gland does not make enough of certain hormones (it is underactive).  When the thyroid is underactive, it produces too little of the hormones thyroxine (T4) and triiodothyronine (T3).  The most common cause is Hashimoto's disease, a disease in which the body's disease-fighting system (immune system) attacks the thyroid gland. The condition can also be caused by viral infections, medicine, pregnancy, or past   radiation treatment to the head or neck.  Symptoms may include weight gain, dry skin, constipation, feeling as though you do not have energy, and not being able to tolerate cold.  This condition is treated with medicine to replace the thyroid hormones that your body does not make. This information  is not intended to replace advice given to you by your health care provider. Make sure you discuss any questions you have with your health care provider. Document Revised: 06/29/2017 Document Reviewed: 06/27/2017 Elsevier Patient Education  2020 [employer]  

## 2020-06-30 NOTE — Progress Notes (Signed)
Subjective:  Patient ID: Kristen Mccarty, female    DOB: 24-Jul-1966  Age: 54 y.o. MRN: [id number]  CC: Hypothyroidism  This visit occurred during the SARS-CoV-2 public health emergency.  Safety protocols were in place, including screening questions prior to the visit, additional usage of staff PPE, and extensive cleaning of exam room while observing appropriate contact time as indicated for disinfecting solutions.    HPI Kristen Mccarty presents for f/up- She was recently seen at an urgent care center and treated for left-sided otitis externa.  She is using the eardrops and says the pain has resolved.  Her only complaint at this time is fatigue.  Outpatient Medications Prior to Visit  Medication Sig Dispense Refill  . ibuprofen (ADVIL,MOTRIN) 600 MG tablet Take 1 tablet (600 mg total) by mouth every 8 (eight) hours as needed. 60 tablet 1  . levothyroxine (SYNTHROID) 75 MCG tablet Take 1 tablet (75 mcg total) by mouth daily before breakfast. TAKE 1 TABLET BY MOUTH DAILY BEFORE BREAKFAST. 90 tablet 4  . phenazopyridine (PYRIDIUM) 200 MG tablet Take 1 tablet (200 mg total) by mouth 3 (three) times daily as needed. 6 tablet 0  . sulfamethoxazole-trimethoprim (BACTRIM DS) 800-160 MG tablet Take 1 tablet by mouth 2 (two) times daily. One PO BID x 3 days 6 tablet 0  . tranexamic acid (LYSTEDA) 650 MG TABS tablet Take 2 tablets 3 times a day at onset of cycle 30 tablet 12  . VITAMIN D PO Take by mouth daily.     No facility-administered medications prior to visit.    ROS Review of Systems  Constitutional: Positive for fatigue. Negative for appetite change and unexpected weight change.  HENT: Negative.  Negative for ear discharge, ear pain, facial swelling and sore throat.   Eyes: Negative.   Respiratory: Negative for cough, chest tightness, shortness of breath and wheezing.   Cardiovascular: Negative for chest pain, palpitations and leg swelling.  Gastrointestinal: Negative for abdominal pain,  constipation, diarrhea, nausea and vomiting.  Endocrine: Negative.  Negative for cold intolerance and heat intolerance.  Genitourinary: Negative.  Negative for difficulty urinating and dysuria.  Musculoskeletal: Negative for arthralgias.  Skin: Negative.   Neurological: Negative.  Negative for dizziness and weakness.  Hematological: Negative for adenopathy. Does not bruise/bleed easily.  Psychiatric/Behavioral: Negative.     Objective:  BP 126/84   Pulse 71   Temp 97.9 F (36.6 C) (Oral)   Resp 16   Ht 5\' 6"  (1.676 m)   Wt 194 lb (88 kg)   SpO2 98%   BMI 31.31 kg/m   BP Readings from Last 3 Encounters:  06/30/20 126/84  09/23/19 116/63  09/01/19 124/80    Wt Readings from Last 3 Encounters:  06/30/20 194 lb (88 kg)  09/23/19 199 lb (90.3 kg)  09/09/19 199 lb (90.3 kg)    Physical Exam Vitals reviewed.  HENT:     Right Ear: Hearing, tympanic membrane, ear canal and external ear normal.     Left Ear: Hearing, tympanic membrane, ear canal and external ear normal. No swelling or tenderness.     Nose: Nose normal.     Mouth/Throat:     Mouth: Mucous membranes are moist.  Eyes:     General: No scleral icterus.    Conjunctiva/sclera: Conjunctivae normal.  Cardiovascular:     Rate and Rhythm: Normal rate and regular rhythm.     Heart sounds: No murmur heard.   Pulmonary:     Effort: Pulmonary effort is normal.  Breath sounds: No stridor. No wheezing, rhonchi or rales.  Abdominal:     General: Abdomen is flat. Bowel sounds are normal. There is no distension.     Palpations: Abdomen is soft. There is no hepatomegaly, splenomegaly or mass.  Musculoskeletal:        General: Normal range of motion.     Cervical back: Neck supple.     Right lower leg: No edema.     Left lower leg: No edema.  Lymphadenopathy:     Cervical: No cervical adenopathy.  Skin:    General: Skin is warm and dry.  Neurological:     General: No focal deficit present.     Mental Status: She  is alert.  Psychiatric:        Mood and Affect: Mood normal.        Behavior: Behavior normal.     Lab Results  Component Value Date   WBC 7.3 09/01/2019   HGB 12.8 09/01/2019   HCT 38.9 09/01/2019   PLT 338 09/01/2019   GLUCOSE 97 09/01/2019   CHOL 181 09/01/2019   TRIG 132 09/01/2019   HDL 52 09/01/2019   LDLCALC 106 (H) 09/01/2019   ALT 22 09/01/2019   AST 19 09/01/2019   NA 141 09/01/2019   K 4.1 09/01/2019   CL 107 09/01/2019   CREATININE 0.79 09/01/2019   BUN 18 09/01/2019   CO2 25 09/01/2019   TSH 2.35 06/30/2020    MM 3D SCREEN BREAST BILATERAL  Result Date: 10/03/2019 CLINICAL DATA:  Screening. EXAM: DIGITAL SCREENING BILATERAL MAMMOGRAM WITH TOMO AND CAD COMPARISON:  Previous exam(s). ACR Breast Density Category c: The breast tissue is heterogeneously dense, which may obscure small masses. FINDINGS: There are no findings suspicious for malignancy. Images were processed with CAD. IMPRESSION: No mammographic evidence of malignancy. A result letter of this screening mammogram will be mailed directly to the patient. RECOMMENDATION: Screening mammogram in one year. (Code:[id number]-01Y) BI-RADS CATEGORY  1: Negative. Electronically Signed   By: Ammie Ferrier M.D.   On: 10/03/2019 10:15    Assessment & Plan:   Kristen Mccarty was seen today for hypothyroidism.  Diagnoses and all orders for this visit:  Acquired hypothyroidism- Her TSH is in the normal range.  She will remain on the current dose of levothyroxine. -     TSH; Future -     TSH  Acute swimmer's ear of left side- Based on her symptoms and exam this has resolved.   I am having Kristen Mccarty maintain her ibuprofen, VITAMIN D PO, tranexamic acid, levothyroxine, sulfamethoxazole-trimethoprim, and phenazopyridine.  No orders of the defined types were placed in this encounter.    Follow-up: Return in about 6 months (around 12/29/2020).  Kristen Calico, MD

## 2020-07-01 DIAGNOSIS — H60332 Swimmer's ear, left ear: Secondary | ICD-10-CM | POA: Insufficient documentation

## 2020-07-01 LAB — TSH: TSH: 2.35 u[IU]/mL (ref 0.35–4.50)

## 2020-09-01 ENCOUNTER — Other Ambulatory Visit: Payer: Self-pay

## 2020-09-01 ENCOUNTER — Encounter: Payer: Self-pay | Admitting: Nurse Practitioner

## 2020-09-01 ENCOUNTER — Ambulatory Visit (INDEPENDENT_AMBULATORY_CARE_PROVIDER_SITE_OTHER): Payer: Managed Care, Other (non HMO) | Admitting: Nurse Practitioner

## 2020-09-01 VITALS — BP 124/80 | Ht 66.0 in | Wt 193.0 lb

## 2020-09-01 DIAGNOSIS — Z01419 Encounter for gynecological examination (general) (routine) without abnormal findings: Secondary | ICD-10-CM | POA: Diagnosis not present

## 2020-09-01 DIAGNOSIS — Z713 Dietary counseling and surveillance: Secondary | ICD-10-CM

## 2020-09-01 DIAGNOSIS — N921 Excessive and frequent menstruation with irregular cycle: Secondary | ICD-10-CM | POA: Diagnosis not present

## 2020-09-01 NOTE — Progress Notes (Signed)
   Kristen Mccarty 11-10-1965 [id number]   History:  55 y.o. G3P0003 presents for annual exam. BTL. Normal pap and mammogram history. 01/2019 benign endometrial polyp. History of menorrhagia well controlled with Lysteda. Her cycles have become irregular ranging from 1-3 months. Denies menopausal symptoms but has struggled with weight loss despite healthy eating and exercise. Hypothyroidism managed by PCP.   Gynecologic History Patient's last menstrual period was 08/02/2020. Period Pattern: (!) Irregular Menstrual Flow: Moderate Menstrual Control: Maxi pad,Tampon Dysmenorrhea: (!) Mild Dysmenorrhea Symptoms: Cramping Contraception/Family planning: tubal ligation  Health Maintenance Last Pap: 08/27/2018. Results were: normal Last mammogram: 10/03/2019. Results were: normal Last colonoscopy: 2021. Results were: normal, 10-year recall Last Dexa: Never  Past medical history, past surgical history, family history and social history were all reviewed and documented in the EPIC chart.  ROS:  A ROS was performed and pertinent positives and negatives are included.  Exam:  Vitals:   09/01/20 0906  BP: 124/80  Weight: 193 lb (87.5 kg)  Height: 5\' 6"  (1.676 m)   Body mass index is 31.15 kg/m.  General appearance:  Normal Thyroid:  Symmetrical, normal in size, without palpable masses or nodularity. Respiratory  Auscultation:  Clear without wheezing or rhonchi Cardiovascular  Auscultation:  Regular rate, without rubs, murmurs or gallops  Edema/varicosities:  Not grossly evident Abdominal  Soft,nontender, without masses, guarding or rebound.  Liver/spleen:  No organomegaly noted  Hernia:  None appreciated  Skin  Inspection:  Grossly normal   Breasts: Examined lying and sitting.   Right: Without masses, retractions, discharge or axillary adenopathy.   Left: Without masses, retractions, discharge or axillary adenopathy. Gentitourinary   Inguinal/mons:  Normal without inguinal  adenopathy  External genitalia:  Normal  BUS/Urethra/Skene's glands:  Normal  Vagina:  Normal  Cervix:  Normal  Uterus:  Normal in size, shape and contour.  Midline and mobile  Adnexa/parametria:     Rt: Without masses or tenderness.   Lt: Without masses or tenderness.  Anus and perineum: Normal  Digital rectal exam: Normal sphincter tone without palpated masses or tenderness  Assessment/Plan:  55 y.o. G3P0003 for annual exam.   Well female exam with routine gynecological exam - Education provided on SBEs, importance of preventative screenings, current guidelines, high calcium diet, regular exercise, and multivitamin daily. Labs with PCP.   Menorrhagia with irregular cycle - changes in cycle regularity most likely due to perimenopause. Taking Lysteda during first few days of cycles with good improvement of heavy bleeding.   Encounter for weight loss counseling - she has struggled with weight loss for 1.5 years despite healthy eating and exercise. She has not experienced weight gain in about a year but feels she has hit a plateau. We discussed importance of calorie deficit for weight loss with diet and exercise. Recommended weight loss clinic for management. She is agreeable.   Screening for cervical cancer - Normal Pap history.  Will repeat at 5-year interval per guidelines.  Screening for breast cancer - Normal mammogram history.  Continue annual screenings.  Normal breast exam today.  Screening for colon cancer - 2021 colonoscopy. Will repeat at GI's recommended interval.   Follow up in 1 year for annual.     Tamela Gammon [facility], 9:20 AM 09/01/2020

## 2020-09-01 NOTE — Patient Instructions (Addendum)
[facility] Maintenance, Female Adopting a healthy lifestyle and getting preventive care are important in promoting health and wellness. Ask your health care provider about:  The right schedule for you to have regular tests and exams.  Things you can do on your own to prevent diseases and keep yourself healthy. What should I know about diet, weight, and exercise? Eat a healthy diet  Eat a diet that includes plenty of vegetables, fruits, low-fat dairy products, and lean protein.  Do not eat a lot of foods that are high in solid fats, added sugars, or sodium.   Maintain a healthy weight Body mass index (BMI) is used to identify weight problems. It estimates body fat based on height and weight. Your health care provider can help determine your BMI and help you achieve or maintain a healthy weight. Get regular exercise Get regular exercise. This is one of the most important things you can do for your health. Most adults should:  Exercise for at least 150 minutes each week. The exercise should increase your heart rate and make you sweat (moderate-intensity exercise).  Do strengthening exercises at least twice a week. This is in addition to the moderate-intensity exercise.  Spend less time sitting. Even light physical activity can be beneficial. Watch cholesterol and blood lipids Have your blood tested for lipids and cholesterol at 53 years of age, then have this test every 5 years. Have your cholesterol levels checked more often if:  Your lipid or cholesterol levels are high.  You are older than 53 years of age.  You are at high risk for heart disease. What should I know about cancer screening? Depending on your health history and family history, you may need to have cancer screening at various ages. This may include screening for:  Breast cancer.  Cervical cancer.  Colorectal cancer.  Skin cancer.  Lung cancer. What should I know about heart disease, diabetes, and high  blood pressure? Blood pressure and heart disease  High blood pressure causes heart disease and increases the risk of stroke. This is more likely to develop in people who have high blood pressure readings, are of African descent, or are overweight.  Have your blood pressure checked: ? Every 3-5 years if you are 35-67 years of age. ? Every year if you are 26 years old or older. Diabetes Have regular diabetes screenings. This checks your fasting blood sugar level. Have the screening done:  Once every three years after age 1 if you are at a normal weight and have a low risk for diabetes.  More often and at a younger age if you are overweight or have a high risk for diabetes. What should I know about preventing infection? Hepatitis B If you have a higher risk for hepatitis B, you should be screened for this virus. Talk with your health care provider to find out if you are at risk for hepatitis B infection. Hepatitis C Testing is recommended for:  Everyone born from [birth year] through 1963.  Anyone with known risk factors for hepatitis C. Sexually transmitted infections (STIs)  Get screened for STIs, including gonorrhea and chlamydia, if: ? You are sexually active and are younger than 53 years of age. ? You are older than 53 years of age and your health care provider tells you that you are at risk for this type of infection. ? Your sexual activity has changed since you were last screened, and you are at increased risk for chlamydia or gonorrhea. Ask your  health care provider if you are at risk.  Ask your health care provider about whether you are at high risk for HIV. Your health care provider may recommend a prescription medicine to help prevent HIV infection. If you choose to take medicine to prevent HIV, you should first get tested for HIV. You should then be tested every 3 months for as long as you are taking the medicine. Pregnancy  If you are about to stop having your period  (premenopausal) and you may become pregnant, seek counseling before you get pregnant.  Take 400 to 800 micrograms (mcg) of folic acid every day if you become pregnant.  Ask for birth control (contraception) if you want to prevent pregnancy. Osteoporosis and menopause Osteoporosis is a disease in which the bones lose minerals and strength with aging. This can result in bone fractures. If you are 24 years old or older, or if you are at risk for osteoporosis and fractures, ask your health care provider if you should:  Be screened for bone loss.  Take a calcium or vitamin D supplement to lower your risk of fractures.  Be given hormone replacement therapy (HRT) to treat symptoms of menopause. Follow these instructions at home: Lifestyle  Do not use any products that contain nicotine or tobacco, such as cigarettes, e-cigarettes, and chewing tobacco. If you need help quitting, ask your health care provider.  Do not use street drugs.  Do not share needles.  Ask your health care provider for help if you need support or information about quitting drugs. Alcohol use  Do not drink alcohol if: ? Your health care provider tells you not to drink. ? You are pregnant, may be pregnant, or are planning to become pregnant.  If you drink alcohol: ? Limit how much you use to 0-1 drink a day. ? Limit intake if you are breastfeeding.  Be aware of how much alcohol is in your drink. In the [place] one drink equals one 12 oz bottle of beer (355 mL), one 5 oz glass of wine (148 mL), or one 1 oz glass of hard liquor (44 mL). General instructions  Schedule regular health, dental, and eye exams.  Stay current with your vaccines.  Tell your health care provider if: ? You often feel depressed. ? You have ever been abused or do not feel safe at home. Summary  Adopting a healthy lifestyle and getting preventive care are important in promoting health and wellness.  Follow your health care provider's  instructions about healthy diet, exercising, and getting tested or screened for diseases.  Follow your health care provider's instructions on monitoring your cholesterol and blood pressure. This information is not intended to replace advice given to you by your health care provider. Make sure you discuss any questions you have with your health care provider. Document Revised: 07/10/2018 Document Reviewed: 07/10/2018 Elsevier Patient Education  2021 [employer].

## 2020-10-07 ENCOUNTER — Other Ambulatory Visit (HOSPITAL_COMMUNITY): Payer: Self-pay

## 2020-10-07 MED FILL — BD 3 ML SYRINGE 25GX1-1/2: 25G X 1-1/2 | 28 days supply | Qty: 4 | Fill #0

## 2020-10-07 MED FILL — METFORMIN HCL 500 MG TABS: 500 | 30 days supply | Qty: 60 | Fill #0

## 2020-10-07 MED FILL — ARMOUR THYROID 60 MG TABLET: 60 | 30 days supply | Qty: 30 | Fill #0

## 2020-10-07 MED FILL — CYANOCOBALAMIN 1,000 MCG/ML: 1000 | 28 days supply | Qty: 4 | Fill #0

## 2020-11-01 ENCOUNTER — Other Ambulatory Visit (HOSPITAL_COMMUNITY): Payer: Self-pay

## 2020-11-01 MED FILL — Thyroid Tab 60 MG (1 Grain): ORAL | 30 days supply | Qty: 30 | Fill #0 | Status: AC

## 2020-11-10 ENCOUNTER — Other Ambulatory Visit (HOSPITAL_COMMUNITY): Payer: Self-pay

## 2020-11-10 MED FILL — Metformin HCl Tab 500 MG: ORAL | 30 days supply | Qty: 60 | Fill #0 | Status: AC

## 2020-12-15 ENCOUNTER — Other Ambulatory Visit (HOSPITAL_COMMUNITY): Payer: Self-pay

## 2020-12-15 MED FILL — Thyroid Tab 60 MG (1 Grain): ORAL | 30 days supply | Qty: 30 | Fill #1 | Status: AC

## 2020-12-28 ENCOUNTER — Other Ambulatory Visit (HOSPITAL_COMMUNITY): Payer: Self-pay

## 2020-12-28 MED FILL — Metformin HCl Tab 500 MG: ORAL | 30 days supply | Qty: 60 | Fill #1 | Status: AC

## 2021-01-06 ENCOUNTER — Other Ambulatory Visit (HOSPITAL_COMMUNITY): Payer: Self-pay

## 2021-01-06 MED ORDER — CYANOCOBALAMIN 1000 MCG/ML IJ SOLN
INTRAMUSCULAR | 2 refills | Status: DC
  Filled 2021-01-06: qty 4, 28d supply, fill #0
  Filled 2021-02-05: qty 4, 28d supply, fill #1
  Filled 2021-03-03: qty 4, 28d supply, fill #2
  Filled 2021-03-26: qty 1, 7d supply, fill #3
  Filled 2021-03-26: qty 3, 21d supply, fill #3
  Filled 2021-04-06: qty 4, 28d supply, fill #4
  Filled 2021-05-16: qty 4, 28d supply, fill #5
  Filled 2021-06-20: qty 4, 28d supply, fill #6
  Filled 2021-07-19: qty 4, 28d supply, fill #7
  Filled 2021-08-17: qty 4, 28d supply, fill #8
  Filled 2021-09-13: qty 3, 21d supply, fill #9

## 2021-01-06 MED ORDER — PEN NEEDLES 32G X 6 MM MISC
2 refills | Status: AC
  Filled 2021-01-06: qty 100, 90d supply, fill #0

## 2021-01-06 MED ORDER — "SYRINGE 25G X 1-1/2"" 3 ML MISC"
2 refills | Status: AC
  Filled 2021-01-06: qty 4, 28d supply, fill #0
  Filled 2021-03-26: qty 4, 28d supply, fill #1
  Filled 2021-06-03 – 2021-06-20 (×2): qty 4, 28d supply, fill #2
  Filled 2021-07-19 (×2): qty 4, 28d supply, fill #3
  Filled 2021-08-17: qty 12, 84d supply, fill #4

## 2021-01-06 MED ORDER — OZEMPIC (0.25 OR 0.5 MG/DOSE) 2 MG/1.5ML ~~LOC~~ SOPN
PEN_INJECTOR | SUBCUTANEOUS | 2 refills | Status: AC
  Filled 2021-01-06: qty 1.5, 28d supply, fill #0
  Filled 2021-02-09: qty 1.5, 28d supply, fill #1
  Filled 2021-03-05 – 2021-03-26 (×3): qty 1.5, 28d supply, fill #2

## 2021-01-06 MED FILL — Thyroid Tab 60 MG (1 Grain): ORAL | 30 days supply | Qty: 30 | Fill #2 | Status: AC

## 2021-02-05 ENCOUNTER — Other Ambulatory Visit (HOSPITAL_COMMUNITY): Payer: Self-pay

## 2021-02-09 ENCOUNTER — Other Ambulatory Visit (HOSPITAL_COMMUNITY): Payer: Self-pay

## 2021-02-10 ENCOUNTER — Other Ambulatory Visit (HOSPITAL_COMMUNITY): Payer: Self-pay

## 2021-02-14 ENCOUNTER — Other Ambulatory Visit (HOSPITAL_COMMUNITY): Payer: Self-pay

## 2021-02-15 ENCOUNTER — Other Ambulatory Visit (HOSPITAL_COMMUNITY): Payer: Self-pay

## 2021-02-16 ENCOUNTER — Other Ambulatory Visit (HOSPITAL_COMMUNITY): Payer: Self-pay

## 2021-02-17 ENCOUNTER — Other Ambulatory Visit (HOSPITAL_COMMUNITY): Payer: Self-pay

## 2021-02-18 ENCOUNTER — Other Ambulatory Visit (HOSPITAL_COMMUNITY): Payer: Self-pay

## 2021-02-18 MED ORDER — METFORMIN HCL 500 MG PO TABS
ORAL_TABLET | Freq: Two times a day (BID) | ORAL | 0 refills | Status: AC
  Filled 2021-02-18: qty 60, 30d supply, fill #0
  Filled 2021-03-26: qty 60, 30d supply, fill #1

## 2021-02-18 MED ORDER — METFORMIN HCL 500 MG PO TABS
ORAL_TABLET | ORAL | 0 refills | Status: AC
  Filled 2021-02-18: qty 180, 90d supply, fill #0
  Filled 2021-11-28: qty 60, 30d supply, fill #0
  Filled 2021-12-22: qty 60, 30d supply, fill #1

## 2021-02-22 ENCOUNTER — Other Ambulatory Visit (HOSPITAL_BASED_OUTPATIENT_CLINIC_OR_DEPARTMENT_OTHER): Payer: Self-pay

## 2021-03-03 ENCOUNTER — Other Ambulatory Visit (HOSPITAL_COMMUNITY): Payer: Self-pay

## 2021-03-05 ENCOUNTER — Other Ambulatory Visit (HOSPITAL_COMMUNITY): Payer: Self-pay

## 2021-03-07 ENCOUNTER — Other Ambulatory Visit (HOSPITAL_COMMUNITY): Payer: Self-pay

## 2021-03-19 ENCOUNTER — Other Ambulatory Visit (HOSPITAL_COMMUNITY): Payer: Self-pay

## 2021-03-26 ENCOUNTER — Other Ambulatory Visit (HOSPITAL_COMMUNITY): Payer: Self-pay

## 2021-03-26 MED FILL — Thyroid Tab 60 MG (1 Grain): ORAL | 30 days supply | Qty: 30 | Fill #3 | Status: AC

## 2021-03-28 ENCOUNTER — Other Ambulatory Visit (HOSPITAL_COMMUNITY): Payer: Self-pay

## 2021-04-06 ENCOUNTER — Other Ambulatory Visit (HOSPITAL_COMMUNITY): Payer: Self-pay

## 2021-04-14 ENCOUNTER — Other Ambulatory Visit (HOSPITAL_COMMUNITY): Payer: Self-pay

## 2021-04-14 MED ORDER — METFORMIN HCL 500 MG PO TABS
ORAL_TABLET | Freq: Every day | ORAL | 0 refills | Status: DC
  Filled 2021-04-14: qty 180, 90d supply, fill #0
  Filled 2021-04-30: qty 60, 30d supply, fill #0
  Filled 2021-06-03: qty 60, 30d supply, fill #1
  Filled 2021-07-11: qty 60, 30d supply, fill #2

## 2021-04-21 ENCOUNTER — Other Ambulatory Visit (HOSPITAL_COMMUNITY): Payer: Self-pay

## 2021-04-21 MED ORDER — OZEMPIC (0.25 OR 0.5 MG/DOSE) 2 MG/1.5ML ~~LOC~~ SOPN
PEN_INJECTOR | SUBCUTANEOUS | 2 refills | Status: DC
  Filled 2021-04-21: qty 4.5, 90d supply, fill #0
  Filled 2021-04-30: qty 1.5, 28d supply, fill #0

## 2021-04-21 MED ORDER — VITAMIN D 50 MCG (2000 UT) PO TABS: ORAL_TABLET | Freq: Every day | ORAL | 5 refills | Status: AC

## 2021-04-21 MED ORDER — OZEMPIC (1 MG/DOSE) 4 MG/3ML ~~LOC~~ SOPN
PEN_INJECTOR | SUBCUTANEOUS | 1 refills | Status: DC
  Filled 2021-04-21 – 2021-05-02 (×2): qty 3, 28d supply, fill #0
  Filled 2021-06-03: qty 3, 28d supply, fill #1
  Filled 2021-07-04: qty 3, 28d supply, fill #2
  Filled 2021-08-01: qty 3, 28d supply, fill #3
  Filled 2021-08-17: qty 3, 28d supply, fill #4
  Filled 2021-09-26: qty 3, 28d supply, fill #5

## 2021-04-29 ENCOUNTER — Other Ambulatory Visit (HOSPITAL_COMMUNITY): Payer: Self-pay

## 2021-04-30 ENCOUNTER — Other Ambulatory Visit (HOSPITAL_COMMUNITY): Payer: Self-pay

## 2021-05-02 ENCOUNTER — Other Ambulatory Visit (HOSPITAL_COMMUNITY): Payer: Self-pay

## 2021-05-07 ENCOUNTER — Other Ambulatory Visit (HOSPITAL_COMMUNITY): Payer: Self-pay

## 2021-05-14 ENCOUNTER — Encounter: Payer: Self-pay | Admitting: Nurse Practitioner

## 2021-05-16 ENCOUNTER — Other Ambulatory Visit (HOSPITAL_COMMUNITY): Payer: Self-pay

## 2021-05-16 ENCOUNTER — Other Ambulatory Visit: Payer: Self-pay | Admitting: Nurse Practitioner

## 2021-05-16 DIAGNOSIS — R300 Dysuria: Secondary | ICD-10-CM

## 2021-05-16 MED ORDER — SULFAMETHOXAZOLE-TRIMETHOPRIM 800-160 MG PO TABS
1.0000 | ORAL_TABLET | Freq: Two times a day (BID) | ORAL | 0 refills | Status: AC
  Filled 2021-05-16: qty 6, 3d supply, fill #0

## 2021-05-16 NOTE — Telephone Encounter (Signed)
I will send in Bactrim twice daily x 3 days. If her symptoms persist she will need to be seen. Thank you.

## 2021-05-16 NOTE — Telephone Encounter (Signed)
Hi Kristen Mccarty I know office is recommend. I don't see you have any appointments today. And you are off this week after tomorrow.

## 2021-06-03 ENCOUNTER — Other Ambulatory Visit (HOSPITAL_COMMUNITY): Payer: Self-pay

## 2021-06-20 ENCOUNTER — Other Ambulatory Visit (HOSPITAL_COMMUNITY): Payer: Self-pay

## 2021-07-04 ENCOUNTER — Other Ambulatory Visit (HOSPITAL_COMMUNITY): Payer: Self-pay

## 2021-07-11 ENCOUNTER — Other Ambulatory Visit (HOSPITAL_COMMUNITY): Payer: Self-pay

## 2021-07-14 ENCOUNTER — Other Ambulatory Visit (HOSPITAL_COMMUNITY): Payer: Self-pay

## 2021-07-14 MED ORDER — OZEMPIC (1 MG/DOSE) 4 MG/3ML ~~LOC~~ SOPN
PEN_INJECTOR | SUBCUTANEOUS | 2 refills | Status: AC
  Filled 2021-07-14: qty 3, 28d supply, fill #0

## 2021-07-14 MED ORDER — METFORMIN HCL 500 MG PO TABS
1000.0000 mg | ORAL_TABLET | Freq: Every day | ORAL | 2 refills | Status: DC
  Filled 2021-07-14: qty 180, 90d supply, fill #0
  Filled 2022-01-09: qty 60, 30d supply, fill #0
  Filled 2022-02-24: qty 60, 30d supply, fill #1
  Filled 2022-04-11: qty 60, 30d supply, fill #2
  Filled 2022-05-22: qty 60, 30d supply, fill #3

## 2021-07-19 ENCOUNTER — Other Ambulatory Visit (HOSPITAL_COMMUNITY): Payer: Self-pay

## 2021-07-31 IMAGING — MG DIGITAL SCREENING BILAT W/ TOMO W/ CAD
8 series · 8 of 24 positions shown · non-contrast
Comparison: Previous exam(s).

CLINICAL DATA: Screening.

EXAM:
DIGITAL SCREENING BILATERAL MAMMOGRAM WITH TOMO AND CAD

[L CC synth-2D]
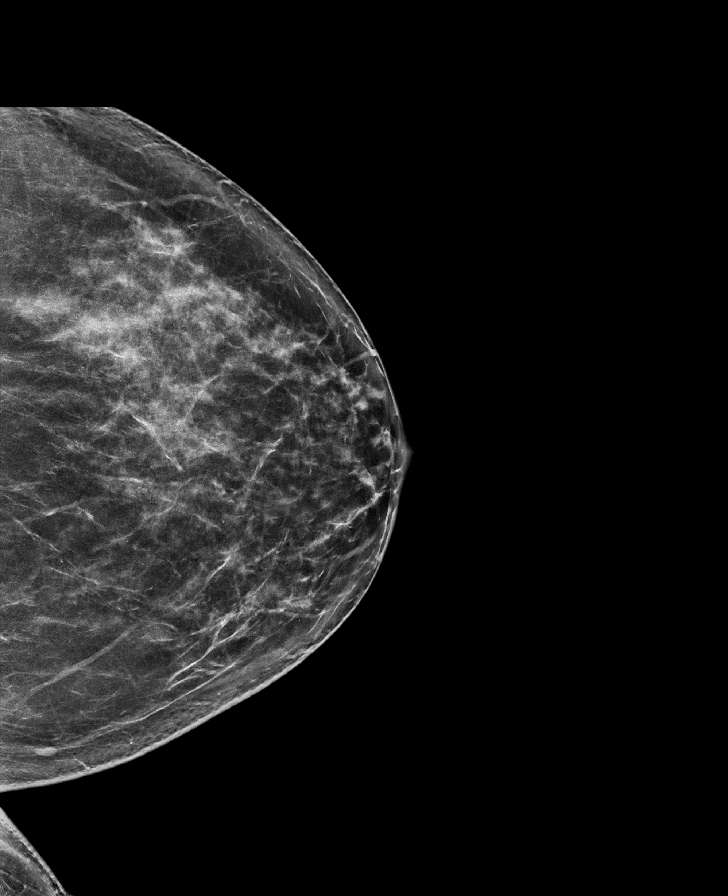

[R MLO synth-2D]
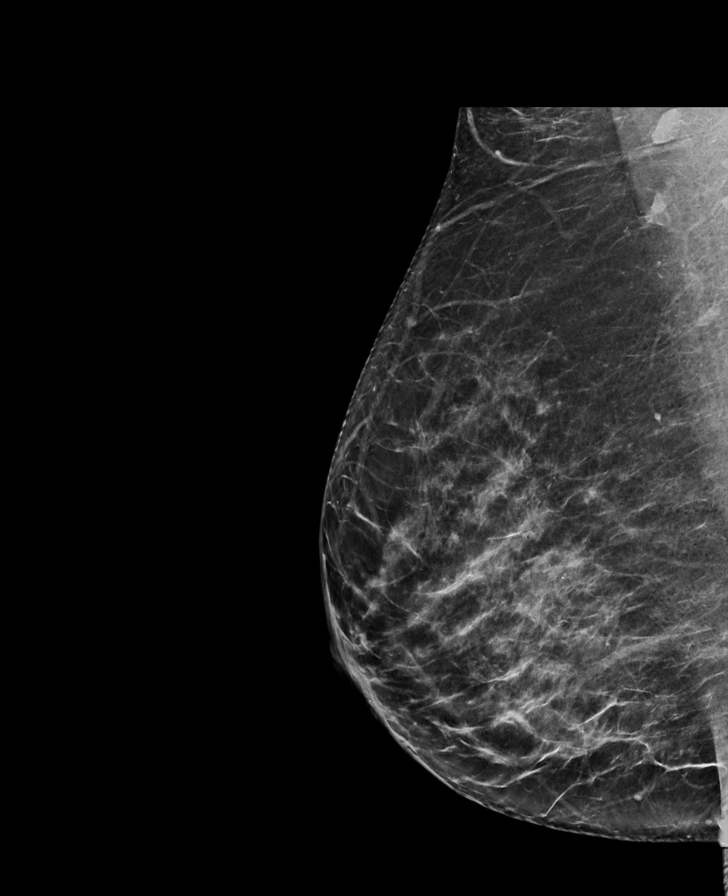

[L MLO synth-2D]
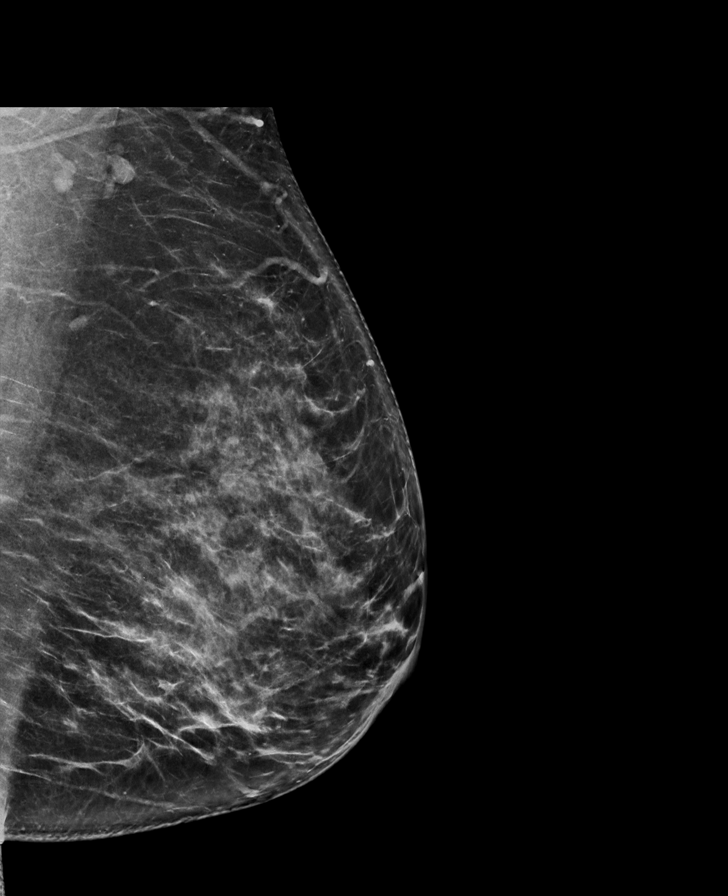

[R CC synth-2D]
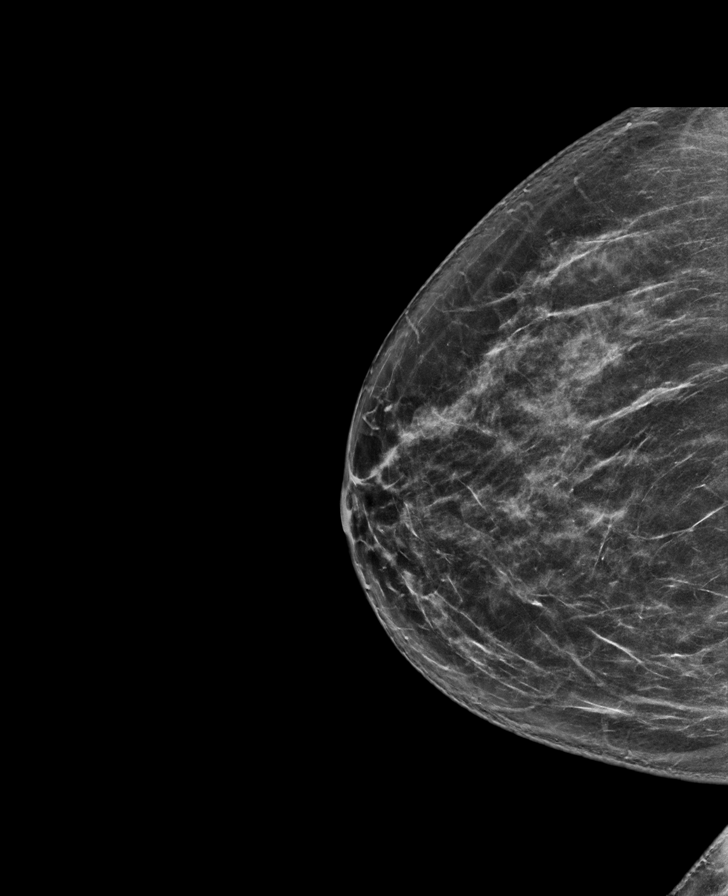

[L MLO tomo · tomo slice 43/84.0]
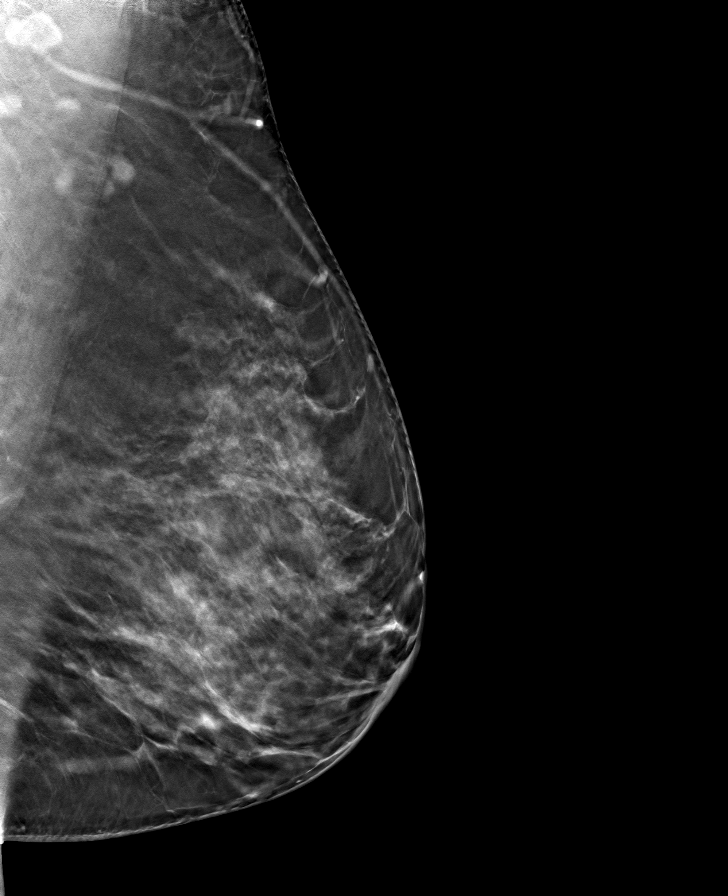

[R MLO tomo · tomo slice 41/81.0]
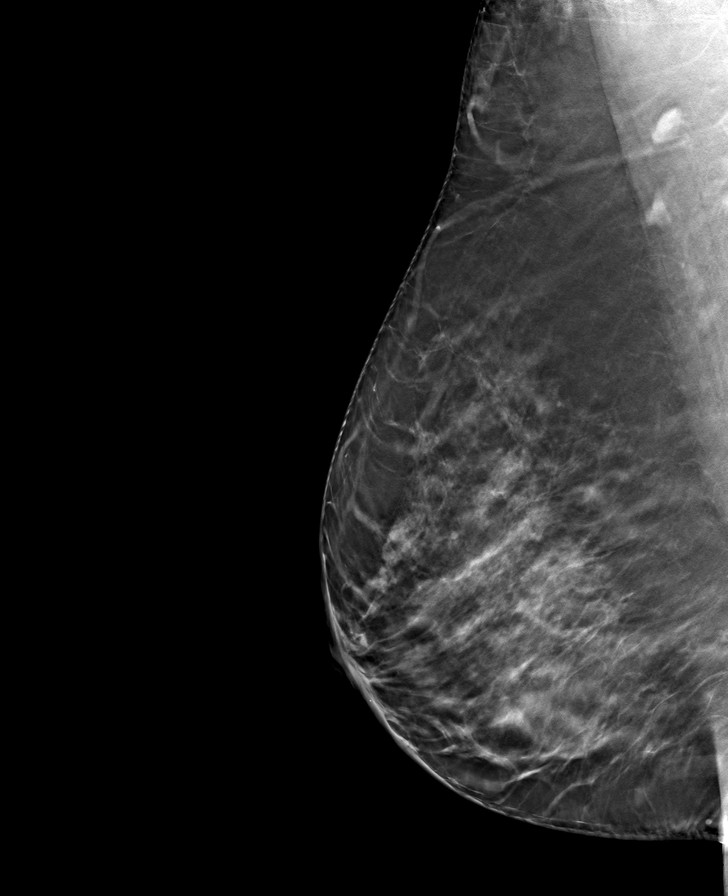

[R CC tomo · tomo slice 39/78.0]
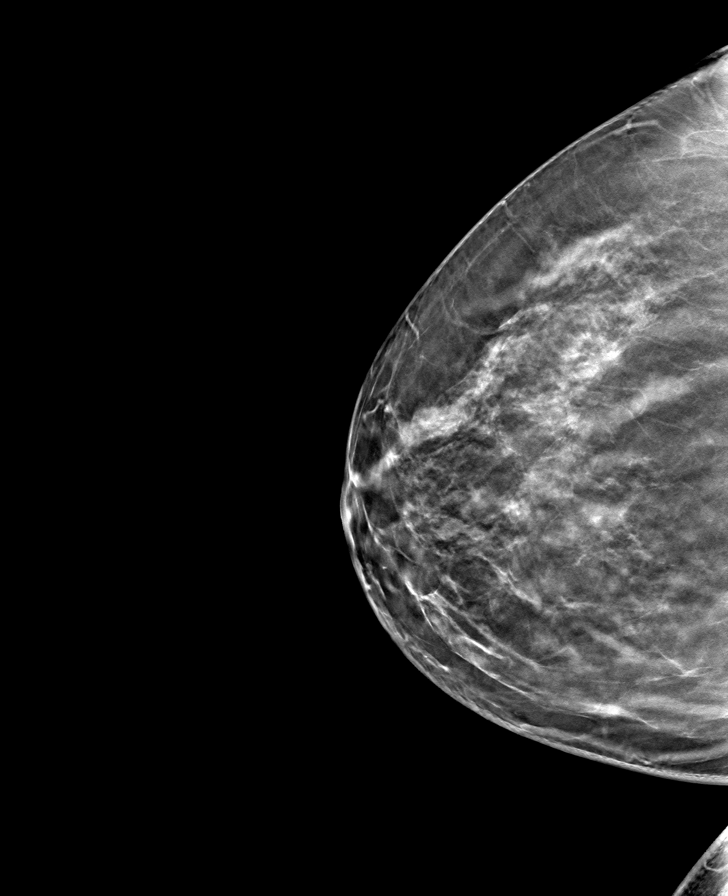

[L CC tomo · tomo slice 41/80.0]
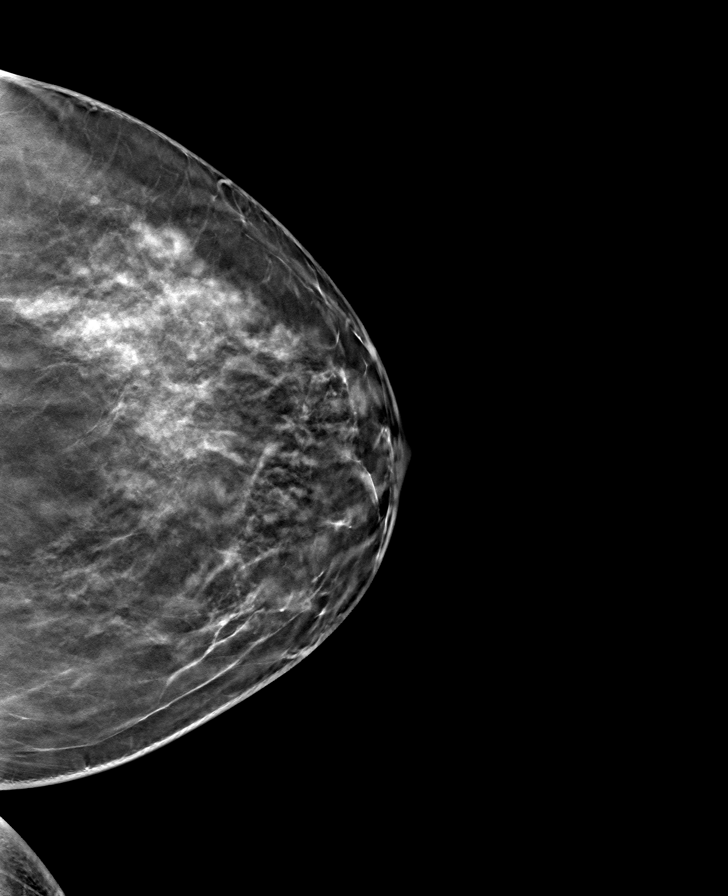

[8 of 24 positions shown; findings below may reference images not displayed]

ACR Breast Density Category c: The breast tissue is heterogeneously
dense, which may obscure small masses.
FINDINGS: There are no findings suspicious for malignancy. Images were
processed with CAD.
IMPRESSION: No mammographic evidence of malignancy. A result letter of this
screening mammogram will be mailed directly to the patient.

RECOMMENDATION:
Screening mammogram in one year. (Code:[id number])

BI-RADS CATEGORY  1: Negative.

## 2021-08-01 ENCOUNTER — Other Ambulatory Visit (HOSPITAL_COMMUNITY): Payer: Self-pay

## 2021-08-02 ENCOUNTER — Other Ambulatory Visit (HOSPITAL_COMMUNITY): Payer: Self-pay

## 2021-08-02 MED ORDER — ARMOUR THYROID 60 MG PO TABS
ORAL_TABLET | ORAL | 3 refills | Status: AC
  Filled 2021-08-02 – 2021-10-21 (×2): qty 30, 30d supply, fill #0
  Filled 2021-12-16: qty 30, 30d supply, fill #1
  Filled 2022-02-24: qty 30, 30d supply, fill #2
  Filled 2022-04-11: qty 30, 30d supply, fill #3
  Filled 2022-05-23: qty 30, 30d supply, fill #4

## 2021-08-10 ENCOUNTER — Other Ambulatory Visit (HOSPITAL_COMMUNITY): Payer: Self-pay

## 2021-08-17 ENCOUNTER — Other Ambulatory Visit (HOSPITAL_COMMUNITY): Payer: Self-pay

## 2021-08-17 MED ORDER — METFORMIN HCL 500 MG PO TABS
ORAL_TABLET | Freq: Every day | ORAL | 0 refills | Status: AC
  Filled 2021-08-17: qty 60, 30d supply, fill #0
  Filled 2021-09-20: qty 60, 30d supply, fill #1
  Filled 2021-10-21: qty 60, 30d supply, fill #2

## 2021-08-17 MED FILL — Thyroid Tab 60 MG (1 Grain): ORAL | 30 days supply | Qty: 30 | Fill #4 | Status: AC

## 2021-08-18 ENCOUNTER — Other Ambulatory Visit (HOSPITAL_COMMUNITY): Payer: Self-pay

## 2021-08-18 MED ORDER — "SYRINGE/NEEDLE (DISP) 25G X 1"" 3 ML MISC"
1 refills | Status: AC
  Filled 2021-08-18: qty 12, 84d supply, fill #0
  Filled 2021-11-28: qty 12, 84d supply, fill #1

## 2021-09-05 ENCOUNTER — Other Ambulatory Visit: Payer: Self-pay

## 2021-09-05 ENCOUNTER — Encounter: Payer: Self-pay | Admitting: Nurse Practitioner

## 2021-09-05 ENCOUNTER — Ambulatory Visit (INDEPENDENT_AMBULATORY_CARE_PROVIDER_SITE_OTHER): Payer: Managed Care, Other (non HMO) | Admitting: Nurse Practitioner

## 2021-09-05 ENCOUNTER — Other Ambulatory Visit: Payer: Self-pay | Admitting: Nurse Practitioner

## 2021-09-05 VITALS — BP 122/76 | Ht 65.5 in | Wt 183.0 lb

## 2021-09-05 DIAGNOSIS — Z01419 Encounter for gynecological examination (general) (routine) without abnormal findings: Secondary | ICD-10-CM

## 2021-09-05 DIAGNOSIS — Z13820 Encounter for screening for osteoporosis: Secondary | ICD-10-CM

## 2021-09-05 DIAGNOSIS — Z8262 Family history of osteoporosis: Secondary | ICD-10-CM

## 2021-09-05 DIAGNOSIS — N951 Menopausal and female climacteric states: Secondary | ICD-10-CM | POA: Diagnosis not present

## 2021-09-05 DIAGNOSIS — Z1231 Encounter for screening mammogram for malignant neoplasm of breast: Secondary | ICD-10-CM

## 2021-09-05 NOTE — Progress Notes (Signed)
° °  Aeron Ky [birth year] [id number]   History:  56 y.o. G3P0003 presents for annual exam. Irregular menses for the last couple of years with night sweats. LMP in August 2022. Normal pap and mammogram history. 01/2019 benign endometrial polyp.  Hypothyroidism, weight loss management (on Ozempic) managed by PCP.   Gynecologic History Patient's last menstrual period was 03/29/2021 (approximate).   Contraception/Family planning: tubal ligation Sexually active: Yes  Health Maintenance Last Pap: 08/27/2018. Results were: Normal, 5-year repeat Last mammogram: 10/02/2019. Results were: Normal Last colonoscopy: 09/23/2019. Results were: Normal, 10-year recall Last Dexa: Never  Past medical history, past surgical history, family history and social history were all reviewed and documented in the EPIC chart. 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 2 sons, 1 daughter. 1 granddaughter 32 months old. Mother with osteoporosis.   ROS:  A ROS was performed and pertinent positives and negatives are included.  Exam:  Vitals:   09/05/21 0849  BP: 122/76  Weight: 183 lb (83 kg)  Height: 5' 5.5" (1.664 m)    Body mass index is 29.99 kg/m.  General appearance:  Normal Thyroid:  Symmetrical, normal in size, without palpable masses or nodularity. Respiratory  Auscultation:  Clear without wheezing or rhonchi Cardiovascular  Auscultation:  Regular rate, without rubs, murmurs or gallops  Edema/varicosities:  Not grossly evident Abdominal  Soft,nontender, without masses, guarding or rebound.  Liver/spleen:  No organomegaly noted  Hernia:  None appreciated  Skin  Inspection:  Grossly normal   Breasts: Examined lying and sitting.   Right: Without masses, retractions, discharge or axillary adenopathy.   Left: Without masses, retractions, discharge or axillary adenopathy. Genitourinary   Inguinal/mons:  Normal without inguinal adenopathy  External genitalia:  Normal appearing vulva with no masses,  tenderness, or lesions  BUS/Urethra/Skene's glands:  Normal  Vagina:  Normal appearing with normal color and discharge, no lesions  Cervix:  Normal appearing without discharge or lesions  Uterus:  Normal in size, shape and contour.  Midline and mobile, nontender  Adnexa/parametria:     Rt: Normal in size, without masses or tenderness.   Lt: Normal in size, without masses or tenderness.  Anus and perineum: Normal  Digital rectal exam: Normal sphincter tone without palpated masses or tenderness  Patient informed chaperone available to be present for breast and pelvic exam. Patient has requested no chaperone to be present. Patient has been advised what will be completed during breast and pelvic exam.   Assessment/Plan:  56 y.o. G3P0003 for annual exam.   Well female exam with routine gynecological exam - Education provided on SBEs, importance of preventative screenings, current guidelines, high calcium diet, regular exercise, and multivitamin daily. Labs with PCP.   Family history of osteoporosis in mother - Plan: DG Bone Density  Perimenopausal - Plan: DG Bone Density. LMP August 2022. Having some night sweats, tolerable.   Screening for cervical cancer - Normal Pap history.  Will repeat at 5-year interval per guidelines.  Screening for breast cancer - Normal mammogram history.  Continue annual screenings.  Normal breast exam today.  Screening for colon cancer - 2021 colonoscopy. Will repeat at 10-year interval per GI recommendation.   Screening for osteoporosis - Plan: DG Bone Density. Due to mother's history we will schedule a baseline bone density. Recommend Vitamin D + Calcium supplement and regular exercise.   Follow up in 1 year for annual.     Tamela Gammon [facility], 9:02 AM 09/05/2021

## 2021-09-13 ENCOUNTER — Other Ambulatory Visit (HOSPITAL_COMMUNITY): Payer: Self-pay

## 2021-09-13 ENCOUNTER — Telehealth: Payer: Self-pay

## 2021-09-13 NOTE — Telephone Encounter (Signed)
I spoke with patient and explained that I will fax order over to local [facility] and provided her with phone number so that she can call and schedule her appt.

## 2021-09-13 NOTE — Telephone Encounter (Signed)
Order faxed for screening mammo and screening bone density.

## 2021-09-13 NOTE — Telephone Encounter (Signed)
[place] Triage 09/12/21-call from patient who advised her ins does not cover a bone density unless it is at a [place] provider.   Patient needs a referral (possibly at [address] location).   Patient also needs a referral for  mammogram

## 2021-09-14 ENCOUNTER — Other Ambulatory Visit (HOSPITAL_COMMUNITY): Payer: Self-pay

## 2021-09-20 ENCOUNTER — Other Ambulatory Visit (HOSPITAL_COMMUNITY): Payer: Self-pay

## 2021-09-20 ENCOUNTER — Ambulatory Visit: Payer: Managed Care, Other (non HMO)

## 2021-09-20 MED FILL — Thyroid Tab 60 MG (1 Grain): ORAL | 30 days supply | Qty: 30 | Fill #5 | Status: AC

## 2021-09-27 ENCOUNTER — Other Ambulatory Visit (HOSPITAL_COMMUNITY): Payer: Self-pay

## 2021-10-18 ENCOUNTER — Other Ambulatory Visit (HOSPITAL_COMMUNITY): Payer: Self-pay

## 2021-10-18 MED ORDER — CYANOCOBALAMIN 1000 MCG/ML IJ SOLN
INTRAMUSCULAR | 2 refills | Status: AC
  Filled 2021-10-18: qty 4, 28d supply, fill #0
  Filled 2021-11-28: qty 4, 28d supply, fill #1
  Filled 2022-01-04: qty 4, 28d supply, fill #2
  Filled 2022-02-24: qty 4, 28d supply, fill #3
  Filled 2022-04-24: qty 4, 28d supply, fill #4
  Filled 2022-05-22: qty 4, 28d supply, fill #5
  Filled 2022-08-25: qty 4, 28d supply, fill #6

## 2021-10-21 ENCOUNTER — Other Ambulatory Visit (HOSPITAL_COMMUNITY): Payer: Self-pay

## 2021-11-02 ENCOUNTER — Other Ambulatory Visit (HOSPITAL_COMMUNITY): Payer: Self-pay

## 2021-11-02 MED ORDER — SEMAGLUTIDE (1 MG/DOSE) 4 MG/3ML ~~LOC~~ SOPN
PEN_INJECTOR | SUBCUTANEOUS | 1 refills | Status: AC
Start: 2021-11-02 — End: ?
  Filled 2021-11-02: qty 3, 30d supply, fill #0
  Filled 2022-02-24: qty 3, 30d supply, fill #1
  Filled 2022-05-22: qty 3, 30d supply, fill #2

## 2021-11-04 ENCOUNTER — Other Ambulatory Visit (HOSPITAL_COMMUNITY): Payer: Self-pay

## 2021-11-04 MED ORDER — OZEMPIC (2 MG/DOSE) 8 MG/3ML ~~LOC~~ SOPN
PEN_INJECTOR | SUBCUTANEOUS | 6 refills | Status: DC
  Filled 2021-11-04: qty 3, 28d supply, fill #0
  Filled 2022-01-04: qty 3, 28d supply, fill #1
  Filled 2022-02-02: qty 3, 28d supply, fill #2
  Filled 2022-03-17: qty 3, 28d supply, fill #3
  Filled 2022-04-15: qty 3, 28d supply, fill #4
  Filled 2022-05-26: qty 3, 28d supply, fill #0
  Filled 2022-06-23: qty 3, 28d supply, fill #1
  Filled 2022-06-26: qty 3, 28d supply, fill #0

## 2021-11-28 ENCOUNTER — Other Ambulatory Visit (HOSPITAL_COMMUNITY): Payer: Self-pay

## 2021-12-16 ENCOUNTER — Other Ambulatory Visit (HOSPITAL_COMMUNITY): Payer: Self-pay

## 2021-12-22 ENCOUNTER — Other Ambulatory Visit (HOSPITAL_COMMUNITY): Payer: Self-pay

## 2021-12-23 ENCOUNTER — Other Ambulatory Visit (HOSPITAL_COMMUNITY): Payer: Self-pay

## 2022-01-04 ENCOUNTER — Other Ambulatory Visit (HOSPITAL_COMMUNITY): Payer: Self-pay

## 2022-01-05 ENCOUNTER — Other Ambulatory Visit (HOSPITAL_COMMUNITY): Payer: Self-pay

## 2022-01-09 ENCOUNTER — Other Ambulatory Visit (HOSPITAL_COMMUNITY): Payer: Self-pay

## 2022-02-02 ENCOUNTER — Other Ambulatory Visit (HOSPITAL_COMMUNITY): Payer: Self-pay

## 2022-02-24 ENCOUNTER — Other Ambulatory Visit (HOSPITAL_COMMUNITY): Payer: Self-pay

## 2022-03-17 ENCOUNTER — Other Ambulatory Visit (HOSPITAL_COMMUNITY): Payer: Self-pay

## 2022-04-13 ENCOUNTER — Other Ambulatory Visit (HOSPITAL_COMMUNITY): Payer: Self-pay

## 2022-04-15 ENCOUNTER — Other Ambulatory Visit (HOSPITAL_COMMUNITY): Payer: Self-pay

## 2022-04-18 ENCOUNTER — Other Ambulatory Visit (HOSPITAL_COMMUNITY): Payer: Self-pay

## 2022-04-24 ENCOUNTER — Other Ambulatory Visit (HOSPITAL_COMMUNITY): Payer: Self-pay

## 2022-05-22 ENCOUNTER — Other Ambulatory Visit (HOSPITAL_COMMUNITY): Payer: Self-pay

## 2022-05-23 ENCOUNTER — Other Ambulatory Visit (HOSPITAL_COMMUNITY): Payer: Self-pay

## 2022-05-26 ENCOUNTER — Other Ambulatory Visit (HOSPITAL_BASED_OUTPATIENT_CLINIC_OR_DEPARTMENT_OTHER): Payer: Self-pay

## 2022-05-26 ENCOUNTER — Other Ambulatory Visit (HOSPITAL_COMMUNITY): Payer: Self-pay

## 2022-06-14 ENCOUNTER — Other Ambulatory Visit (HOSPITAL_COMMUNITY): Payer: Self-pay

## 2022-06-19 ENCOUNTER — Other Ambulatory Visit: Payer: Self-pay

## 2022-06-23 ENCOUNTER — Other Ambulatory Visit (HOSPITAL_COMMUNITY): Payer: Self-pay

## 2022-06-26 ENCOUNTER — Other Ambulatory Visit (HOSPITAL_COMMUNITY): Payer: Self-pay

## 2022-06-26 ENCOUNTER — Other Ambulatory Visit (HOSPITAL_BASED_OUTPATIENT_CLINIC_OR_DEPARTMENT_OTHER): Payer: Self-pay

## 2022-06-27 ENCOUNTER — Other Ambulatory Visit (HOSPITAL_BASED_OUTPATIENT_CLINIC_OR_DEPARTMENT_OTHER): Payer: Self-pay

## 2022-07-24 ENCOUNTER — Other Ambulatory Visit (HOSPITAL_COMMUNITY): Payer: Self-pay

## 2022-07-25 ENCOUNTER — Other Ambulatory Visit (HOSPITAL_COMMUNITY): Payer: Self-pay

## 2022-07-25 MED ORDER — METFORMIN HCL 500 MG PO TABS: 1000.0000 mg | ORAL_TABLET | Freq: Every day | ORAL | 2 refills | Status: AC

## 2022-07-25 MED ORDER — SEMAGLUTIDE (2 MG/DOSE) 8 MG/3ML ~~LOC~~ SOPN
PEN_INJECTOR | SUBCUTANEOUS | 6 refills | Status: AC
  Filled 2022-07-25: qty 3, 28d supply, fill #0
  Filled 2022-08-19: qty 3, 28d supply, fill #1

## 2022-07-27 ENCOUNTER — Other Ambulatory Visit (HOSPITAL_COMMUNITY): Payer: Self-pay

## 2022-07-27 MED ORDER — METFORMIN HCL 500 MG PO TABS
1000.0000 mg | ORAL_TABLET | Freq: Every day | ORAL | 3 refills | Status: AC
  Filled 2022-07-27: qty 60, 30d supply, fill #0

## 2022-08-22 ENCOUNTER — Other Ambulatory Visit (HOSPITAL_COMMUNITY): Payer: Self-pay

## 2022-08-25 ENCOUNTER — Other Ambulatory Visit (HOSPITAL_COMMUNITY): Payer: Self-pay

## 2022-09-01 ENCOUNTER — Other Ambulatory Visit (HOSPITAL_COMMUNITY): Payer: Self-pay

## 2022-09-06 ENCOUNTER — Ambulatory Visit: Payer: Managed Care, Other (non HMO) | Admitting: Nurse Practitioner
